# Patient Record
Sex: Female | Born: 1935 | Race: White | Hispanic: No | Smoking: Never smoker
Health system: Southern US, Community
[De-identification: ages and names within clinical notes are randomized; demographics above are authoritative.]

## PROBLEM LIST (undated history)

## (undated) DIAGNOSIS — K529 Noninfective gastroenteritis and colitis, unspecified: Secondary | ICD-10-CM

## (undated) DIAGNOSIS — E079 Disorder of thyroid, unspecified: Secondary | ICD-10-CM

## (undated) DIAGNOSIS — K219 Gastro-esophageal reflux disease without esophagitis: Secondary | ICD-10-CM

## (undated) DIAGNOSIS — I10 Essential (primary) hypertension: Secondary | ICD-10-CM

## (undated) HISTORY — PX: FRACTURE SURGERY: SHX138

## (undated) HISTORY — PX: ESOPHAGEAL DILATION: SHX303

---

## 2006-08-17 ENCOUNTER — Encounter: Admission: RE | Admit: 2006-08-17 | Discharge: 2006-08-17 | Payer: Self-pay | Admitting: Gastroenterology

## 2006-09-08 ENCOUNTER — Ambulatory Visit (HOSPITAL_COMMUNITY): Admission: RE | Admit: 2006-09-08 | Discharge: 2006-09-08 | Payer: Self-pay | Admitting: Gastroenterology

## 2007-01-26 ENCOUNTER — Encounter: Admission: RE | Admit: 2007-01-26 | Discharge: 2007-01-26 | Payer: Self-pay | Admitting: Obstetrics and Gynecology

## 2007-04-06 ENCOUNTER — Inpatient Hospital Stay (HOSPITAL_COMMUNITY): Admission: EM | Admit: 2007-04-06 | Discharge: 2007-04-07 | Payer: Self-pay | Admitting: Gastroenterology

## 2007-11-01 ENCOUNTER — Encounter: Admission: RE | Admit: 2007-11-01 | Discharge: 2007-11-01 | Payer: Self-pay | Admitting: Gastroenterology

## 2007-12-21 ENCOUNTER — Ambulatory Visit (HOSPITAL_COMMUNITY): Admission: RE | Admit: 2007-12-21 | Discharge: 2007-12-21 | Payer: Self-pay | Admitting: Gastroenterology

## 2008-02-13 ENCOUNTER — Inpatient Hospital Stay (HOSPITAL_COMMUNITY): Admission: EM | Admit: 2008-02-13 | Discharge: 2008-02-21 | Payer: Self-pay | Admitting: Emergency Medicine

## 2010-10-28 NOTE — Op Note (Signed)
NAMEWHITLEY, PATCHEN                  ACCOUNT NO.:  [id number]   MEDICAL RECORD NO.:  [id number]          PATIENT TYPE:  INP   LOCATION:  1525                         FACILITY:  [facility]   PHYSICIAN:  Georges Lynch. Gioffre, M.D.DATE OF BIRTH:  [birth year]   DATE OF PROCEDURE:  02/14/2008  DATE OF DISCHARGE:                               OPERATIVE REPORT   SURGEON:  Georges Lynch. Darrelyn Hillock, M.D.   ASSISTANT:  Jamelle Rushing, P.A.   PREOPERATIVE DIAGNOSES:  1. Displaced severely comminuted intertrochanteric fracture of the      right hip.  2. Nondisplaced fracture of the greater tuberosity right shoulder.  3. Displaced comminuted fracture of the olecranon of the right elbow      with a large abrasion.  4. Fractures of several ribs on the left.   POSTOPERATIVE DIAGNOSES:  1. Displaced severely comminuted intertrochanteric fracture of the      right hip.  2. Nondisplaced fracture of the greater tuberosity right shoulder.  3. Displaced comminuted fracture of the olecranon of the right elbow      with a large abrasion.  4. Fractures of several ribs on the left.   OPERATIONS:  Open reduction internal fixation of a comminuted  intertrochanteric fracture right hip utilizing the DePuy hip nail plate  device, it was the TK2.  The compression screw length was 85 mm in  length.  The angle on the plate was a five hole plate, 161 degree angle.  Bone graft to the right intertrochanteric fracture.   PROCEDURE IN DETAIL:  With the patient first having 1 gram of IV Ancef  an initial prep of the right hip was carried out followed by a sterile  prep.  Prior to doing this I did a closed manipulation of the hip,  placed the patient's right lower extremity in traction in the usual  fashion.  Then a sterile prep and drape was carried out as I mentioned  above.  C-arm was brought in.  I made an incision over the lateral  aspect of the right hip.  Bleeders identified and cauterized.  At this  time the incision was  taken out to the greater trochanter.  The  iliotibial band was split followed by splinting of the vastus lateralis.  Good hemostasis was maintained.  I then inserted a Bennett retractor and  identified the fracture site.  A drill hole was made in lateral femoral  cortex initially and the guide pin was inserted and the guide pin was  noted to be in anatomical position on the AP and lateral fluoroscopic  views.  At this time the guide pin was measured to be 85 mm in length.  We then utilized the cannulated drill and our drill holes made up into  the femoral neck.  All steps were carried out with the C-arm in place to  make sure we did not go through the hip femoral head.  We had good  position of the head.  We then inserted our nail compression hip screw  plate device and had excellent position.  The five hole plate  was  affixed to the femoral shaft with the appropriate screw length.  At this  time because of the comminution of the fracture I inserted the cancellus  bone graft into the greater trochanteric fracture site.  Prior to doing  this I irrigated the hip out with antibiotic solution.  Once the bone  graft was inserted I then injected 10 mL of FloSeal for hemostasis  purposes and then closed the wound in layers in the usual fashion.  I  did utilize some Gelfoam in the wound.  After the wound was closed  sterile dressings were applied.  Following that we then removed the  cast, the splint that she had on the right elbow.  We inspected the  abrasion.  She had a large abrasion site.  This was not an open  fracture.  This abrasion site was directly over the olecranon site.  We  dressed that with a Betadine dressing and brought the elbow out in  extension to reduce the fracture site.  We felt that this  was the safest thing to do and not to do an open reduction at this time  as the fracture olecranon was comminuted and also because of this large  abrasion.  We applied a posterior plaster  splint and extension and we  used the C-arm for our direction.  The patient then left the operating  room in satisfactory condition.           ______________________________  Georges Lynch Darrelyn Hillock, M.D.     RAG/MEDQ  D:  02/14/2008  T:  02/15/2008  Job:  [id number]   cc:   Thora Lance, M.D.  Fax: [phone]

## 2010-10-28 NOTE — Op Note (Signed)
Yvette Andrews, Yvette Andrews                  ACCOUNT NO.:  [id number]   MEDICAL RECORD NO.:  [id number]          PATIENT TYPE:  AMB   LOCATION:  ENDO                         FACILITY:  [facility]   PHYSICIAN:  Danise Edge, M.D.   DATE OF BIRTH:  08-28-1920   DATE OF PROCEDURE:  12/21/2007  DATE OF DISCHARGE:                               OPERATIVE REPORT   Esophagogastroduodenoscopy with Savary esophageal dilation.   REFERRING PHYSICIAN:  Thora Lance, MD.   PROCEDURE INDICATIONS:  Yvette Andrews is an 77 year old female born on  Nov 24, 1920.  Yvette Andrews is experiencing cervical esophageal  dysphagia.  Her liquid barium and barium tablet swallow shows an  anterior cervical esophageal web.  The barium tablet stopped in the  esophageal body.   PAST MEDICAL HISTORY:  Hypertension; chronic headaches; restless leg  syndrome; gastroesophageal reflux disease; osteopenia; cholecystectomy;  back surgery; neck surgery; urinary bladder dysfunction; and normal  colonoscopy, except for left colonic diverticulosis on April 06, 2007.   FAMILY HISTORY:  Noncontributory.   HABITS:  Yvette Andrews does not use tobacco products or consume alcohol.   ENDOSCOPIST:  Danise Edge, MD.   PREMEDICATION:  1. Fentanyl 50 mcg.  2. Versed 5 mg.   PROCEDURE:  After obtaining informed consent, Yvette Andrews was placed in  the left lateral decubitus position on the fluoroscopy table.  I  administered intravenous fentanyl and intravenous Versed to achieve  conscious sedation for the procedure.  The patient's blood pressure,  oxygen saturation, and cardiac rhythm were monitored throughout the  procedure and documented in the medical record.   The Pentax gastroscope was passed through the posterior hypopharynx into  the proximal esophagus without difficulty.  The hypopharynx, larynx, and  vocal cords appeared normal.   Esophagoscopy:  The proximal mid and lower segments of the esophageal  mucosa appear completely  normal.  The squamocolumnar junction is noted  at 40 cm from the incisor teeth.  There is no esophageal erosions,  obstruction, or stricture formation.  There is no signs of Barrett  esophagus.   Gastroscopy:  Retroflex view of the gastric cardia and fundus was  normal.  The diaphragmatic hiatus was patulous.  The gastric body,  antrum, and pylorus appeared completely normal.   Duodenoscopy:  The duodenal bulb and descending duodenum appeared  normal.   Savary esophageal dilation:  The Savary dilator wire was passed through  the gastroscope and the tip of the guidewire advanced to the distal  gastric antrum, as confirmed endoscopically and fluoroscopically.  The  15-mm Savary dilator passed without resistance.  Repeat  esophagogastrostomy revealed and an intact and normal esophageal mucosa  without signs of esophageal stricture dilation.  There is no gastric  trauma due to the guidewire.   ASSESSMENT:  Completely normal esophagogastroduodenoscopy.  The 15-mm  Savary dilator passed without resistance and without signs of esophageal  stricture dilation.   RECOMMENDATIONS:  I suspect Ms. Heal's cervical esophageal dysphagia  is due to a poorly functioning upper esophageal sphincter, which there  is no good treatment at age 8.  ______________________________  Danise Edge, M.D.     MJ/MEDQ  D:  12/21/2007  T:  12/21/2007  Job:  [id number]   cc:   Thora Lance, M.D.

## 2010-10-28 NOTE — H&P (Signed)
NAMESERRIA, SLOMA                  ACCOUNT NO.:  [id number]   MEDICAL RECORD NO.:  [id number]          PATIENT TYPE:  INP   LOCATION:  1525                         FACILITY:  [facility]   PHYSICIAN:  Georges Lynch. Gioffre, M.D.DATE OF BIRTH:  [birth year]   DATE OF ADMISSION:  02/13/2008  DATE OF DISCHARGE:                              HISTORY & PHYSICAL   CHIEF COMPLAINT:  Right elbow, right hip, rib cage pain.   HISTORY OF PRESENT ILLNESS:  The patient is a 77 year old female who was  walking down the driveway today when her legs gave out, tripping,  falling, landing on her right side.  She had pain.  She was brought to  emergency room for evaluation. She was found to have a right hip three-  part intertrochanteric fracture, a right olecranon process, a fracture  of right proximal greater tuberosity humeral fracture, and multiple rib  fractures.  The patient's last meal was an Ensure about 1 o'clock today.  The patient will be admitted for pain control and surgical correction.   PAST MEDICAL HISTORY:  Includes thyroid disease, hypertension, chronic  headaches, GERD, osteopenia, bladder dysfunction.   PAST SURGICAL HISTORY:  Includes endoscopy with esophageal dilatation,  neck surgery, gallbladder surgery.   FAMILY MEDICAL HISTORY:  Patient is an elderly female.  She does not  smoke or use alcohol.   ALLERGIES:  CODEINE.   CURRENT MEDICATIONS:  Topamax, Synthroid, Requip, Benicar, Sanctura,  Fosamax, OxyContin, aspirin, __________, Microbid, multivitamins and  Effexor XR.   REVIEW OF SYSTEMS:  Review of systems is noncontributory for any  respiratory, cardiac, neuro, hematologic.  She still does have a little  difficulty at times swallowing.   PHYSICAL EXAMINATION:  VITALS:  Temperature of 98.3, pulse of 93,  respirations 16, blood pressure is 144/81.  GENERAL:  This is an elderly appearing, well-developed female, appears  to be uncomfortable.  HEENT: Head was normocephalic.  Pupils  equal, round, reactive.  Hearing  is intact.  NECK:  Neck was supple.  No palpable lymphadenopathy.  Good range of  motion without any discomfort.  CHEST:  Lung sounds were clear throughout.  She did have some left-sided  rib cage pain.  CARDIOVASCULAR:  Heart was regular rate and rhythm and no murmurs, rubs,  gallops.  ABDOMEN:  Abdomen was soft, nontender.  Bowel.  Present.  Pelvis was stable.  EXTREMITIES:  The right arm was in a long posterior splint. Her hand was  neurologically intact.  Her right lower extremity was externally rotated  and shortened. She had a good sensation and pulses in the foot, left  lower extremity and upper extremities normal without any discomfort.  NEURO:  She had no gross neurologic defects noted.   LABORATORY DATA:  Labs and x-rays shows that she has multiple part right  olecranon process fracture.  She has multiple part right hip  intertrochanteric fracture.  She has multiple right and left rib  fractures.  She has a proximal greater tuberosity fracture.   Chemical labs are pending.   IMPRESSION:  1. Right elbow olecranon fracture.  2. Right  hip intertrochanteric fracture.  3. Right proximal humerus greater tuberosity fracture.  4. Multiple ribs fracture.   DISPOSITION:  At this time this patient will be admitted to [facility] under the care of  Dr. Ranee Gosselin.  The patient did routine  preoperative lab work.  Due to the surgical schedule in the operating  room, she will be scheduled for tomorrow at 4:30 at the first available  time.      Jamelle Rushing, P.A.    ______________________________  Georges Lynch Darrelyn Hillock, M.D.    RWK/MEDQ  D:  02/13/2008  T:  02/14/2008  Job:  [id number]

## 2010-10-28 NOTE — Discharge Summary (Signed)
NAMESANJUANA, Yvette Andrews                  ACCOUNT NO.:  [id number]   MEDICAL RECORD NO.:  [id number]          PATIENT TYPE:  INP   LOCATION:  1525                         FACILITY:  [facility]   PHYSICIAN:  Georges Lynch. Gioffre, M.D.DATE OF BIRTH:  1920-07-01   DATE OF ADMISSION:  02/13/2008  DATE OF DISCHARGE:                               DISCHARGE SUMMARY   Yvette Andrews was admitted on hospital on August 31, taken to surgery on  February 14, 2008, at which time I did:  (1) An open reduction and  internal fixation of a severely comminuted intertrochanteric fracture of  the right hip.  (2) Did a closed manipulation of a comminuted fracture  of her right proximal ulna.  She was placed in a long-arm splint with  her elbow in extension for reduction of the fracture.  The fracture site  actually had a large abrasion over there and there was no way that I  would consider that fracture at that time.  Also, the olecranon fracture  was slightly comminuted and the fact that she is 77 years old, I did not  think that was the thing to do as far as any open reduction.  Postop she  was monitored.  She was kept on the Coumadin and heparin protocol and  did quite nicely.  She had no specific complications.  I would not allow  her to weightbear.  The most I let her do in the hospital stay was bed  to chair, and we did get her up with a lift.  Her wound was examined  daily and she was seen daily.  We did manage her initially with IV  antibiotics.  In the hospital we followed her hemoglobins and  hematocrits and she was given 2 units of packed cells in the OR and  postop she was given 2 more units, so she had a total of 4 units.  Her  hemoglobin stabilized, it was about 9.3 prior to admission.  Her INR  remained at about 2.5 and she was managed on 5 mg of Coumadin.  As I  said, she was seen daily and had no specific problems.  She remained  afebrile.  On February 21, 2008, the date of discharge, I removed her  posterior splint from the right arm and examined her abrasion.  It was  clean.  It was still open but clean.  There was no drainage, no  infection.  I reapplied new dressings and placed her back in a long-arm  splint in extension.  Her wound was fine as far as her right hip.  Circulation in the lower extremities was intact.   The laboratory studies are all documented on the chart.  She had  multiple studies done in the hospital.  Her initial CBC was 12.8,  hemoglobin, and this was done on August 31, was 9.9 and the hematocrit  was 30.3.  The platelets were 149,000.  The INR on August 31 was 1.2.  The prothrombin time was 15.1, the PTT was 28.  The C-metabolic was  within the normal limits except her  glucose was slightly elevated at  120.  Her initial BUN was 28, upper limits of normal was 23.  Her  potassium was 4.3, her sodium 141.  Chloride was 107.  The urinalysis  was positive for nitrites.  She had a large amount of leukocytes as  well.  She was maintained on IV antibiotics.  She had many bacteria in  her urine.  I do not have any results of a urine culture, though.   The x-rays, the initial x-ray of her hip showed a comminuted  intertrochanteric fracture of the right hip.  The right elbow showed a  fracture, displaced, of the olecranon which was comminuted.  She had x-  rays of the right tibia and fibula that showed no acute findings.  Initial x-ray showed that she had a nondisplaced fracture of the  proximal humeral head, more of an avulsion-type fracture.  X-rays also  showed 2 rib fractures on the left.  EKG showed sinus tachycardia and a  T-wave abnormality, and it was reported as an abnormal EKG.   FINAL DISCHARGE DIAGNOSES:  1. Rib fractures x2 on the left, nondisplaced.  2. Nondisplaced fracture of the proximal humeral head.  3. Displaced comminuted fracture of her olecranon, right elbow.  4. Abrasion, right elbow.  5. Displaced intertrochanteric fracture of the right  hip.  6. Hypertension.  7. Gastroesophageal reflux disease.  8. Osteopenia.  9. Chronic headaches.  10.Hypertension.  11.Bladder dysfunction.  12.Thyroid disease.   DISCHARGE MEDICATIONS:  She will be maintained on:  1. Dilaudid 2 mg every 8 hours p.r.n. for pain.  2. Would try to use Tylenol for pain first to see if that would help.  3. Coumadin 5 mg daily.  4. Effexor XR 150 mg a day.  5. Methenamine hippurate 1000 mg daily.  6. Requip 1 mg tablet before sleep.  7. Synthroid 0.025 mg daily or as reported as 25 mcg.  8. Benicar 20 mg daily.  9. Vesicare mg daily.  10.Colace 100 mg b.i.d. as a stool softener.  11.Trinsicon 1 by mouth t.i.d. for 3 weeks.   DISCHARGE INSTRUCTIONS:  1. MONITOR HER INR ON A WEEKLY BASIS.  NEXT INR SHOULD BE DONE NEXT      MONDAY, SEPTEMBER 14.  2. She should remain on her Coumadin for a total of 3 more weeks and      then off of the Coumadin and on one baby aspirin a day.  3. The INR should be maintained once a week until she is off the      Coumadin.  4. There is absolutely no walking, no weightbearing on the right hip      because of her hip fracture.  5. She can be up from bed to chair.  6. She should have a pillow under each leg at all times to keep her      heels off the bed.  7. She should be able to be turned from side to side with a pillow      between her legs to make sure she does not develop a pressure sore      in her buttocks.  8. Far as the care of the fracture of the right elbow, nothing needs      to be done except to leave her in the splint and you can put a      pillow under that to keep it elevated.  9. Last but not least, SHE SHOULD BE SEEN IN THE  OFFICE A WEEK FROM      THIS THURSDAY, WHICH WOULD BE SEPTEMBER 17.  She can come by      ambulance and we will verify that for her.  If there are any      questions, any problems, call Dr. Darrelyn Hillock.  My number at the office      as [phone].  My extensions are [phone] or  [phone].           ______________________________  Georges Lynch. Darrelyn Hillock, M.D.     RAG/MEDQ  D:  02/21/2008  T:  02/21/2008  Job:  [id number]

## 2010-10-28 NOTE — H&P (Signed)
Yvette Andrews, MANDERS                  ACCOUNT NO.:  [id number]   MEDICAL RECORD NO.:  [id number]          PATIENT TYPE:  INP   LOCATION:  1535                         [facility]ACILITY:  [facility]   PHYSICIAN:  Danise Edge, M.D.   DATE OF BIRTH:  [birth year]   DATE OF ADMISSION:  04/06/2007  DATE OF DISCHARGE:  04/07/2007                              HISTORY & PHYSICAL   ADMISSION DIAGNOSIS:  1. Resolved lower gastrointestinal bleeding.  2. Esophageal dysphagia.   HISTORY:  Yvette Andrews is an 77 year old female born [birth year].  [name] was admitted to [facility] April 06, 2007 to  evaluate and treat painless hematochezia.  There is no past history of  colorectal neoplasia.  She has never undergone a lower gastrointestinal  evaluation.  On September 08, 2006, she underwent an  esophagogastroduodenoscopy to dilate a symptomatic cervical esophageal  web.  Her esophagogastroduodenoscopy was otherwise normal.  She has  redeveloped mild cervical esophageal dysphagia.  On June 11, 2006,  her complete metabolic profile and thyroid-stimulating hormone level  were normal.  Her hemoglobin was 12.4 grams.  White blood cell count  6800.  Platelet count 196,000.   ALLERGIES:  CODEINE.   CHRONIC MEDICATIONS:  Chronic   MEDICATIONS:  1. Topamax 50 mg twice daily.  2. Synthroid 25 mcg daily.  3. Requip 1 mg nightly.  4. Benicar 20 mg daily.  5. Sanctura 820 mg twice daily.  6. Fosamax 35 mg weekly.  7. OxyContin 20 mg twice daily.  8. Aspirin 81 mg daily.  9. Aciphex 20 mg daily.  10.Macrobid 100 mg twice daily.  11.Multivitamin daily.  12.Effexor XR 75 mg daily.   PAST MEDICAL HISTORY:  1. Hypertension.  2. Chronic headaches.  3. Restless leg syndrome.  4. Gastroesophageal reflux disease.  5. Osteopenia.  6. Cholecystectomy.  7. Back surgery.  8. Neck surgery September 08, 2006.  9. Esophagogastroduodenoscopy was normal with disruption of cervical      esophageal webs  using the 15 mm Savary dilator.  10.Urinary bladder dysfunction.   FAMILY HISTORY:  Noncontributory.   HABITS:  Mrs. Voth does not use tobacco products or consume alcohol.   PHYSICAL EXAMINATION:  VITAL SIGNS:  Blood pressure 128/70.  HEENT:  Sclerae nonicteric.  Oropharynx normal.  Dentition in poor  repair.  Tympanic membranes and external canals normal.  NECK:  No carotid.  No thyroid enlargement.  LUNGS:  Clear to auscultation.  CARDIAC:  Regular rhythm without murmurs, clicks or rubs.  ABDOMEN:  Soft, flat and nontender.  EXTREMITIES:  No edema.   ASSESSMENT:  1. Resolved lower gastrointestinal bleeding.  2. Cervical esophageal dysphagia.   PROCEDURE:  Esophagogastroduodenoscopy.   ENDOSCOPIST:  Danise Edge, M.D.   PREMEDICATION:  Versed 5 mg, fentanyl 50 mcg.   DESCRIPTION OF PROCEDURE:  After obtaining informed consent, Yvette Andrews  was placed in the left lateral decubitus position.  I administered  intravenous fentanyl and intravenous Versed to achieve conscious  sedation for the procedure.  The patient's blood pressure, oxygen  saturation and cardiac rhythm were monitored throughout  the procedure  and documented in the medical record.  The Pentax gastroscope was passed  through the posterior hypopharynx into the proximal esophagus without  difficulty.  The hypopharynx, larynx and vocal cords appeared normal.   Esophagoscopy:  The proximal mid and lower segments of the esophageal  mucosa appear completely normal.  The squamocolumnar junction and  esophagogastric junction are noted at approximately 37 cm from the  incisor teeth.   Gastroscopy:  Retroflex view of the gastric cardia and fundus was  normal.  The gastric body, antrum and pylorus appeared normal.   Duodenoscopy:  The duodenal bulb and descending duodenum appeared  normal.   ASSESSMENT:  Normal esophagogastroduodenoscopy.   PROCEDURE:  Proctocolonoscopy to the cecum.   DESCRIPTION OF PROCEDURE:   Anal inspection and digital rectal exam were  normal.  The Pentax pediatric colonoscope was introduced into the rectum  and with moderate difficulty advanced to the cecum as identified by a  normal-appearing ileocecal valve and appendiceal orifice.  Colonoscopic  advancement was tender by colonic loop formation.  Preparation for the  colonoscopy today was excellent.   Rectum normal:  Retroflex view of the distal rectum normal.  Sigmoid colon and descending colon:  Left colonic diverticulosis.  Splenic flexure normal.  Transverse colon normal.  Hepatic flexure normal.  Ascending colon normal.  Cecum and ileocecal valve normal.   ASSESSMENT:  Left colonic diverticulosis; otherwise normal  proctocolonoscopy to the cecum.  No signs of colorectal neoplasia or  gastrointestinal bleeding.   PLAN:  Yvette Andrews will be discharged from the hospital this morning.  She will resume taking her chronic medications.  She will follow-up with  Dr. Kirby Funk.   DISCHARGE LABORATORY DATA:  CBC with differential normal.  Complete  metabolic profile normal.  Thyroid stimulating hormone level normal.           ______________________________  Danise Edge, M.D.     MJ/MEDQ  D:  04/07/2007  T:  04/08/2007  Job:  [id number]   cc:   Thora Lance, M.D.  Fax: [phone]

## 2010-10-31 NOTE — Op Note (Signed)
Yvette Andrews, Yvette Andrews                  ACCOUNT NO.:  [id number]   MEDICAL RECORD NO.:  [id number]          PATIENT TYPE:  AMB   LOCATION:  ENDO                         FACILITY:  [facility]   PHYSICIAN:  Danise Edge, M.D.   DATE OF BIRTH:  08-01-1920   DATE OF PROCEDURE:  09/08/2006  DATE OF DISCHARGE:                               OPERATIVE REPORT   PROCEDURE:  Esophagogastroduodenoscopy and Savary esophageal dilation.   REFERRING PHYSICIAN:  Dr. Kirby Funk.   PROCEDURE INDICATIONS:  [name] is an 78 year old female born  09/10/1920.  When [name] developed cervical esophageal  dysphagia, she underwent a barium swallow x-ray which revealed a  cervical esophageal web or stricture.   ENDOSCOPIST:  Danise Edge, M.D.   PREMEDICATION:  Fentanyl 75 mcg, Versed 4 mg.   PROCEDURE:  After obtaining informed consent Ms. Reuter was placed in  the left lateral decubitus position on the fluoroscopy table.  I  administered intravenous fentanyl and intravenous Versed to achieve  conscious sedation for the procedure.  The patient's blood pressure,  oxygen saturation and cardiac rhythm were monitored throughout the  procedure and documented in the medical record.   The Pentax gastroscope was easily passed through the posterior  hypopharynx into the proximal esophagus without difficulty.  The  patient's blood pressure, oxygen saturation and cardiac rhythm were  monitored throughout the procedure and documented in the medical record.   The hypopharynx, larynx and vocal cords appeared normal.   Esophagoscopy:  The proximal, mid and lower segments of the esophageal  mucosa appeared normal.  I suspect I ruptured a cervical esophageal web  passing the Pentax gastroscope through the hypopharynx and into the  proximal esophagus.   Gastroscopy:  Retroflex view of the gastric cardia and fundus was  normal.  The diaphragmatic hiatus was slightly patulous.  The gastric  body, antrum and  pylorus appeared normal.   Duodenoscopy:  The duodenal bulb and descending duodenum appeared  normal.   Savary esophageal dilation:  The Savary dilator wire was passed through  the Pentax gastroscope and advanced to the distal gastric antrum as  confirmed both endoscopically and fluoroscopically.  Under fluoroscopic  guidance, the 12.8-mm and 15-mm Savary dilators passed without  resistance.  Repeat esophagogastrostomy revealed disruption of a  probable cervical esophageal web and no gastric trauma due to the  guidewire.   ASSESSMENT:  Cervical esophageal web disrupted with the gastroscope.  Esophageal dilation was then performed with the 12.8-mm and 15-mm Savary  dilators.  Otherwise normal esophagogastroduodenoscopy.           ______________________________  Danise Edge, M.D.     MJ/MEDQ  D:  09/08/2006  T:  09/08/2006  Job:  [id number]   cc:   Thora Lance, M.D.

## 2011-03-18 LAB — BASIC METABOLIC PANEL
BUN: 13
BUN: 14
BUN: 21
CO2: 30
Calcium: 8 — ABNORMAL LOW
Chloride: 102
Chloride: 104
Chloride: 99
Creatinine, Ser: 0.77
Creatinine, Ser: 0.89
GFR calc Af Amer: >60
GFR calc non Af Amer: >60
GFR calc non Af Amer: >60
Glucose, Bld: 104 — ABNORMAL HIGH
Glucose, Bld: 149 — ABNORMAL HIGH
Potassium: 3.6
Potassium: 3.9
Sodium: 135
Sodium: 137
Sodium: 138
Sodium: 140

## 2011-03-18 LAB — PROTIME-INR
INR: 1.4
INR: 2.1 — ABNORMAL HIGH
INR: 2.1 — ABNORMAL HIGH
INR: 2.5 — ABNORMAL HIGH
INR: 2.7 — ABNORMAL HIGH
INR: 2.9 — ABNORMAL HIGH
Prothrombin Time: 17.8 — ABNORMAL HIGH
Prothrombin Time: 24.4 — ABNORMAL HIGH
Prothrombin Time: 25 — ABNORMAL HIGH
Prothrombin Time: 28.3 — ABNORMAL HIGH
Prothrombin Time: 30.2 — ABNORMAL HIGH
Prothrombin Time: 31 — ABNORMAL HIGH
Prothrombin Time: 32.1 — ABNORMAL HIGH

## 2011-03-18 LAB — URINE CULTURE: Special Requests: NEGATIVE

## 2011-03-18 LAB — URINALYSIS, ROUTINE W REFLEX MICROSCOPIC
Bilirubin Urine: NEGATIVE
Glucose, UA: NEGATIVE
Ketones, ur: NEGATIVE
Nitrite: POSITIVE — AB

## 2011-03-18 LAB — HEMOGLOBIN AND HEMATOCRIT, BLOOD
HCT: 26.2 — ABNORMAL LOW
HCT: 28.4 — ABNORMAL LOW
HCT: 29.1 — ABNORMAL LOW
Hemoglobin: 10.5 — ABNORMAL LOW
Hemoglobin: 8.9 — ABNORMAL LOW

## 2011-03-18 LAB — URINE MICROSCOPIC-ADD ON

## 2011-03-25 LAB — COMPREHENSIVE METABOLIC PANEL
ALT: 32
AST: 39 — ABNORMAL HIGH
Albumin: 3.4 — ABNORMAL LOW
CO2: 23
Chloride: 109
Creatinine, Ser: 1.02
GFR calc Af Amer: >60
GFR calc non Af Amer: 52 — ABNORMAL LOW
Sodium: 138
Total Bilirubin: 0.8

## 2011-03-25 LAB — CBC
MCV: 98
Platelets: 195
RBC: 3.4 — ABNORMAL LOW
WBC: 6.9

## 2011-03-25 LAB — DIFFERENTIAL
Basophils Absolute: 0
Eosinophils Absolute: 0.1
Eosinophils Relative: 2
Lymphocytes Relative: 25
Lymphs Abs: 1.7
Monocytes Absolute: 0.5

## 2011-06-22 DIAGNOSIS — E039 Hypothyroidism, unspecified: Secondary | ICD-10-CM | POA: Diagnosis not present

## 2011-06-22 DIAGNOSIS — R350 Frequency of micturition: Secondary | ICD-10-CM | POA: Diagnosis not present

## 2011-06-22 DIAGNOSIS — Z79899 Other long term (current) drug therapy: Secondary | ICD-10-CM | POA: Diagnosis not present

## 2011-06-22 DIAGNOSIS — E559 Vitamin D deficiency, unspecified: Secondary | ICD-10-CM | POA: Diagnosis not present

## 2011-06-22 DIAGNOSIS — G2589 Other specified extrapyramidal and movement disorders: Secondary | ICD-10-CM | POA: Diagnosis not present

## 2011-06-22 DIAGNOSIS — K219 Gastro-esophageal reflux disease without esophagitis: Secondary | ICD-10-CM | POA: Diagnosis not present

## 2011-06-22 DIAGNOSIS — I10 Essential (primary) hypertension: Secondary | ICD-10-CM | POA: Diagnosis not present

## 2011-06-22 DIAGNOSIS — N390 Urinary tract infection, site not specified: Secondary | ICD-10-CM | POA: Diagnosis not present

## 2011-08-03 DIAGNOSIS — Z79899 Other long term (current) drug therapy: Secondary | ICD-10-CM | POA: Diagnosis not present

## 2011-08-03 DIAGNOSIS — G43109 Migraine with aura, not intractable, without status migrainosus: Secondary | ICD-10-CM | POA: Diagnosis not present

## 2011-08-03 DIAGNOSIS — R32 Unspecified urinary incontinence: Secondary | ICD-10-CM | POA: Diagnosis not present

## 2011-08-03 DIAGNOSIS — E785 Hyperlipidemia, unspecified: Secondary | ICD-10-CM | POA: Diagnosis not present

## 2011-08-03 DIAGNOSIS — E039 Hypothyroidism, unspecified: Secondary | ICD-10-CM | POA: Diagnosis not present

## 2011-08-18 DIAGNOSIS — E039 Hypothyroidism, unspecified: Secondary | ICD-10-CM | POA: Diagnosis not present

## 2011-08-18 DIAGNOSIS — I10 Essential (primary) hypertension: Secondary | ICD-10-CM | POA: Diagnosis not present

## 2011-08-18 DIAGNOSIS — N390 Urinary tract infection, site not specified: Secondary | ICD-10-CM | POA: Diagnosis not present

## 2011-08-18 DIAGNOSIS — K219 Gastro-esophageal reflux disease without esophagitis: Secondary | ICD-10-CM | POA: Diagnosis not present

## 2011-08-18 DIAGNOSIS — L2089 Other atopic dermatitis: Secondary | ICD-10-CM | POA: Diagnosis not present

## 2011-08-18 DIAGNOSIS — E559 Vitamin D deficiency, unspecified: Secondary | ICD-10-CM | POA: Diagnosis not present

## 2011-08-18 DIAGNOSIS — G2589 Other specified extrapyramidal and movement disorders: Secondary | ICD-10-CM | POA: Diagnosis not present

## 2011-08-18 DIAGNOSIS — R350 Frequency of micturition: Secondary | ICD-10-CM | POA: Diagnosis not present

## 2011-09-14 DIAGNOSIS — G8921 Chronic pain due to trauma: Secondary | ICD-10-CM | POA: Diagnosis not present

## 2011-09-14 DIAGNOSIS — E039 Hypothyroidism, unspecified: Secondary | ICD-10-CM | POA: Diagnosis not present

## 2011-09-14 DIAGNOSIS — I10 Essential (primary) hypertension: Secondary | ICD-10-CM | POA: Diagnosis not present

## 2011-10-27 DIAGNOSIS — E039 Hypothyroidism, unspecified: Secondary | ICD-10-CM | POA: Diagnosis not present

## 2011-10-27 DIAGNOSIS — G8921 Chronic pain due to trauma: Secondary | ICD-10-CM | POA: Diagnosis not present

## 2011-10-27 DIAGNOSIS — G43109 Migraine with aura, not intractable, without status migrainosus: Secondary | ICD-10-CM | POA: Diagnosis not present

## 2011-10-27 DIAGNOSIS — I10 Essential (primary) hypertension: Secondary | ICD-10-CM | POA: Diagnosis not present

## 2011-11-27 DIAGNOSIS — L821 Other seborrheic keratosis: Secondary | ICD-10-CM | POA: Diagnosis not present

## 2011-12-07 DIAGNOSIS — N3020 Other chronic cystitis without hematuria: Secondary | ICD-10-CM | POA: Diagnosis not present

## 2011-12-07 DIAGNOSIS — N3941 Urge incontinence: Secondary | ICD-10-CM | POA: Diagnosis not present

## 2011-12-08 DIAGNOSIS — E039 Hypothyroidism, unspecified: Secondary | ICD-10-CM | POA: Diagnosis not present

## 2011-12-08 DIAGNOSIS — I10 Essential (primary) hypertension: Secondary | ICD-10-CM | POA: Diagnosis not present

## 2011-12-08 DIAGNOSIS — G43109 Migraine with aura, not intractable, without status migrainosus: Secondary | ICD-10-CM | POA: Diagnosis not present

## 2012-01-19 DIAGNOSIS — G8921 Chronic pain due to trauma: Secondary | ICD-10-CM | POA: Diagnosis not present

## 2012-01-19 DIAGNOSIS — I10 Essential (primary) hypertension: Secondary | ICD-10-CM | POA: Diagnosis not present

## 2012-01-19 DIAGNOSIS — L600 Ingrowing nail: Secondary | ICD-10-CM | POA: Diagnosis not present

## 2012-01-19 DIAGNOSIS — E039 Hypothyroidism, unspecified: Secondary | ICD-10-CM | POA: Diagnosis not present

## 2012-02-17 DIAGNOSIS — G43109 Migraine with aura, not intractable, without status migrainosus: Secondary | ICD-10-CM | POA: Diagnosis not present

## 2012-02-17 DIAGNOSIS — E039 Hypothyroidism, unspecified: Secondary | ICD-10-CM | POA: Diagnosis not present

## 2012-02-17 DIAGNOSIS — I10 Essential (primary) hypertension: Secondary | ICD-10-CM | POA: Diagnosis not present

## 2012-02-18 DIAGNOSIS — E039 Hypothyroidism, unspecified: Secondary | ICD-10-CM | POA: Diagnosis not present

## 2012-02-18 DIAGNOSIS — I10 Essential (primary) hypertension: Secondary | ICD-10-CM | POA: Diagnosis not present

## 2012-02-24 DIAGNOSIS — I10 Essential (primary) hypertension: Secondary | ICD-10-CM | POA: Diagnosis not present

## 2012-02-24 DIAGNOSIS — E559 Vitamin D deficiency, unspecified: Secondary | ICD-10-CM | POA: Diagnosis not present

## 2012-02-24 DIAGNOSIS — E039 Hypothyroidism, unspecified: Secondary | ICD-10-CM | POA: Diagnosis not present

## 2012-02-24 DIAGNOSIS — Z1331 Encounter for screening for depression: Secondary | ICD-10-CM | POA: Diagnosis not present

## 2012-02-24 DIAGNOSIS — K219 Gastro-esophageal reflux disease without esophagitis: Secondary | ICD-10-CM | POA: Diagnosis not present

## 2012-02-24 DIAGNOSIS — Z0000 Encounter for general adult medical examination without abnormal findings: Secondary | ICD-10-CM | POA: Diagnosis not present

## 2012-02-24 DIAGNOSIS — H8110 Benign paroxysmal vertigo, unspecified ear: Secondary | ICD-10-CM | POA: Diagnosis not present

## 2012-02-24 DIAGNOSIS — G2589 Other specified extrapyramidal and movement disorders: Secondary | ICD-10-CM | POA: Diagnosis not present

## 2012-02-29 DIAGNOSIS — R300 Dysuria: Secondary | ICD-10-CM | POA: Diagnosis not present

## 2012-02-29 DIAGNOSIS — N3020 Other chronic cystitis without hematuria: Secondary | ICD-10-CM | POA: Diagnosis not present

## 2012-03-21 DIAGNOSIS — E039 Hypothyroidism, unspecified: Secondary | ICD-10-CM | POA: Diagnosis not present

## 2012-03-21 DIAGNOSIS — G43109 Migraine with aura, not intractable, without status migrainosus: Secondary | ICD-10-CM | POA: Diagnosis not present

## 2012-03-21 DIAGNOSIS — I10 Essential (primary) hypertension: Secondary | ICD-10-CM | POA: Diagnosis not present

## 2012-03-30 DIAGNOSIS — H109 Unspecified conjunctivitis: Secondary | ICD-10-CM | POA: Diagnosis not present

## 2012-03-30 DIAGNOSIS — J019 Acute sinusitis, unspecified: Secondary | ICD-10-CM | POA: Diagnosis not present

## 2012-04-25 DIAGNOSIS — I10 Essential (primary) hypertension: Secondary | ICD-10-CM | POA: Diagnosis not present

## 2012-04-25 DIAGNOSIS — E039 Hypothyroidism, unspecified: Secondary | ICD-10-CM | POA: Diagnosis not present

## 2012-04-25 DIAGNOSIS — F411 Generalized anxiety disorder: Secondary | ICD-10-CM | POA: Diagnosis not present

## 2012-04-25 DIAGNOSIS — G8921 Chronic pain due to trauma: Secondary | ICD-10-CM | POA: Diagnosis not present

## 2012-05-16 DIAGNOSIS — H04129 Dry eye syndrome of unspecified lacrimal gland: Secondary | ICD-10-CM | POA: Diagnosis not present

## 2012-05-16 DIAGNOSIS — H318 Other specified disorders of choroid: Secondary | ICD-10-CM | POA: Diagnosis not present

## 2012-05-16 DIAGNOSIS — Z961 Presence of intraocular lens: Secondary | ICD-10-CM | POA: Diagnosis not present

## 2012-05-19 DIAGNOSIS — Z23 Encounter for immunization: Secondary | ICD-10-CM | POA: Diagnosis not present

## 2012-05-27 DIAGNOSIS — L819 Disorder of pigmentation, unspecified: Secondary | ICD-10-CM | POA: Diagnosis not present

## 2012-05-27 DIAGNOSIS — L821 Other seborrheic keratosis: Secondary | ICD-10-CM | POA: Diagnosis not present

## 2012-05-27 DIAGNOSIS — L578 Other skin changes due to chronic exposure to nonionizing radiation: Secondary | ICD-10-CM | POA: Diagnosis not present

## 2012-06-02 DIAGNOSIS — I10 Essential (primary) hypertension: Secondary | ICD-10-CM | POA: Diagnosis not present

## 2012-06-02 DIAGNOSIS — E039 Hypothyroidism, unspecified: Secondary | ICD-10-CM | POA: Diagnosis not present

## 2012-06-02 DIAGNOSIS — F411 Generalized anxiety disorder: Secondary | ICD-10-CM | POA: Diagnosis not present

## 2012-07-18 DIAGNOSIS — N3941 Urge incontinence: Secondary | ICD-10-CM | POA: Diagnosis not present

## 2012-07-18 DIAGNOSIS — N3020 Other chronic cystitis without hematuria: Secondary | ICD-10-CM | POA: Diagnosis not present

## 2012-07-18 DIAGNOSIS — N393 Stress incontinence (female) (male): Secondary | ICD-10-CM | POA: Diagnosis not present

## 2012-08-02 DIAGNOSIS — F411 Generalized anxiety disorder: Secondary | ICD-10-CM | POA: Diagnosis not present

## 2012-08-02 DIAGNOSIS — I10 Essential (primary) hypertension: Secondary | ICD-10-CM | POA: Diagnosis not present

## 2012-08-02 DIAGNOSIS — E039 Hypothyroidism, unspecified: Secondary | ICD-10-CM | POA: Diagnosis not present

## 2012-09-06 DIAGNOSIS — M76899 Other specified enthesopathies of unspecified lower limb, excluding foot: Secondary | ICD-10-CM | POA: Diagnosis not present

## 2012-10-04 DIAGNOSIS — F411 Generalized anxiety disorder: Secondary | ICD-10-CM | POA: Diagnosis not present

## 2012-10-04 DIAGNOSIS — E039 Hypothyroidism, unspecified: Secondary | ICD-10-CM | POA: Diagnosis not present

## 2012-10-04 DIAGNOSIS — I10 Essential (primary) hypertension: Secondary | ICD-10-CM | POA: Diagnosis not present

## 2012-10-04 DIAGNOSIS — R6889 Other general symptoms and signs: Secondary | ICD-10-CM | POA: Diagnosis not present

## 2012-11-29 DIAGNOSIS — L819 Disorder of pigmentation, unspecified: Secondary | ICD-10-CM | POA: Diagnosis not present

## 2012-11-29 DIAGNOSIS — L821 Other seborrheic keratosis: Secondary | ICD-10-CM | POA: Diagnosis not present

## 2012-11-29 DIAGNOSIS — L820 Inflamed seborrheic keratosis: Secondary | ICD-10-CM | POA: Diagnosis not present

## 2012-11-29 DIAGNOSIS — L738 Other specified follicular disorders: Secondary | ICD-10-CM | POA: Diagnosis not present

## 2012-12-26 DIAGNOSIS — R300 Dysuria: Secondary | ICD-10-CM | POA: Diagnosis not present

## 2012-12-26 DIAGNOSIS — N390 Urinary tract infection, site not specified: Secondary | ICD-10-CM | POA: Diagnosis not present

## 2012-12-26 DIAGNOSIS — N3020 Other chronic cystitis without hematuria: Secondary | ICD-10-CM | POA: Diagnosis not present

## 2012-12-26 DIAGNOSIS — R319 Hematuria, unspecified: Secondary | ICD-10-CM | POA: Diagnosis not present

## 2013-03-27 DIAGNOSIS — Q619 Cystic kidney disease, unspecified: Secondary | ICD-10-CM | POA: Diagnosis not present

## 2013-03-27 DIAGNOSIS — N3020 Other chronic cystitis without hematuria: Secondary | ICD-10-CM | POA: Diagnosis not present

## 2013-04-05 DIAGNOSIS — Z23 Encounter for immunization: Secondary | ICD-10-CM | POA: Diagnosis not present

## 2013-06-05 DIAGNOSIS — R300 Dysuria: Secondary | ICD-10-CM | POA: Diagnosis not present

## 2013-06-05 DIAGNOSIS — N3020 Other chronic cystitis without hematuria: Secondary | ICD-10-CM | POA: Diagnosis not present

## 2013-07-26 DIAGNOSIS — E039 Hypothyroidism, unspecified: Secondary | ICD-10-CM | POA: Diagnosis not present

## 2013-07-26 DIAGNOSIS — G2589 Other specified extrapyramidal and movement disorders: Secondary | ICD-10-CM | POA: Diagnosis not present

## 2013-07-26 DIAGNOSIS — E559 Vitamin D deficiency, unspecified: Secondary | ICD-10-CM | POA: Diagnosis not present

## 2013-07-26 DIAGNOSIS — K219 Gastro-esophageal reflux disease without esophagitis: Secondary | ICD-10-CM | POA: Diagnosis not present

## 2013-07-26 DIAGNOSIS — R350 Frequency of micturition: Secondary | ICD-10-CM | POA: Diagnosis not present

## 2013-07-26 DIAGNOSIS — I10 Essential (primary) hypertension: Secondary | ICD-10-CM | POA: Diagnosis not present

## 2013-07-26 DIAGNOSIS — R51 Headache: Secondary | ICD-10-CM | POA: Diagnosis not present

## 2013-08-07 DIAGNOSIS — R300 Dysuria: Secondary | ICD-10-CM | POA: Diagnosis not present

## 2013-08-07 DIAGNOSIS — N3020 Other chronic cystitis without hematuria: Secondary | ICD-10-CM | POA: Diagnosis not present

## 2013-08-16 DIAGNOSIS — L819 Disorder of pigmentation, unspecified: Secondary | ICD-10-CM | POA: Diagnosis not present

## 2013-08-16 DIAGNOSIS — L821 Other seborrheic keratosis: Secondary | ICD-10-CM | POA: Diagnosis not present

## 2013-08-16 DIAGNOSIS — D2330 Other benign neoplasm of skin of unspecified part of face: Secondary | ICD-10-CM | POA: Diagnosis not present

## 2013-08-23 DIAGNOSIS — I10 Essential (primary) hypertension: Secondary | ICD-10-CM | POA: Diagnosis not present

## 2013-08-23 DIAGNOSIS — G2589 Other specified extrapyramidal and movement disorders: Secondary | ICD-10-CM | POA: Diagnosis not present

## 2013-08-23 DIAGNOSIS — K219 Gastro-esophageal reflux disease without esophagitis: Secondary | ICD-10-CM | POA: Diagnosis not present

## 2013-08-23 DIAGNOSIS — R51 Headache: Secondary | ICD-10-CM | POA: Diagnosis not present

## 2013-08-23 DIAGNOSIS — R350 Frequency of micturition: Secondary | ICD-10-CM | POA: Diagnosis not present

## 2013-08-23 DIAGNOSIS — R300 Dysuria: Secondary | ICD-10-CM | POA: Diagnosis not present

## 2013-08-23 DIAGNOSIS — E559 Vitamin D deficiency, unspecified: Secondary | ICD-10-CM | POA: Diagnosis not present

## 2013-08-23 DIAGNOSIS — N390 Urinary tract infection, site not specified: Secondary | ICD-10-CM | POA: Diagnosis not present

## 2013-10-12 DIAGNOSIS — N3020 Other chronic cystitis without hematuria: Secondary | ICD-10-CM | POA: Diagnosis not present

## 2013-10-12 DIAGNOSIS — R300 Dysuria: Secondary | ICD-10-CM | POA: Diagnosis not present

## 2013-11-15 DIAGNOSIS — G2589 Other specified extrapyramidal and movement disorders: Secondary | ICD-10-CM | POA: Diagnosis not present

## 2013-11-15 DIAGNOSIS — R350 Frequency of micturition: Secondary | ICD-10-CM | POA: Diagnosis not present

## 2013-11-15 DIAGNOSIS — IMO0002 Reserved for concepts with insufficient information to code with codable children: Secondary | ICD-10-CM | POA: Diagnosis not present

## 2013-11-15 DIAGNOSIS — B351 Tinea unguium: Secondary | ICD-10-CM | POA: Diagnosis not present

## 2013-11-15 DIAGNOSIS — M79609 Pain in unspecified limb: Secondary | ICD-10-CM | POA: Diagnosis not present

## 2013-11-15 DIAGNOSIS — K219 Gastro-esophageal reflux disease without esophagitis: Secondary | ICD-10-CM | POA: Diagnosis not present

## 2013-11-15 DIAGNOSIS — I10 Essential (primary) hypertension: Secondary | ICD-10-CM | POA: Diagnosis not present

## 2013-11-15 DIAGNOSIS — M25569 Pain in unspecified knee: Secondary | ICD-10-CM | POA: Diagnosis not present

## 2013-11-27 ENCOUNTER — Ambulatory Visit: Payer: Self-pay | Admitting: Podiatry

## 2013-12-01 DIAGNOSIS — S20219A Contusion of unspecified front wall of thorax, initial encounter: Secondary | ICD-10-CM | POA: Diagnosis not present

## 2014-01-08 DIAGNOSIS — R300 Dysuria: Secondary | ICD-10-CM | POA: Diagnosis not present

## 2014-01-08 DIAGNOSIS — N3020 Other chronic cystitis without hematuria: Secondary | ICD-10-CM | POA: Diagnosis not present

## 2014-03-07 DIAGNOSIS — Z23 Encounter for immunization: Secondary | ICD-10-CM | POA: Diagnosis not present

## 2014-03-07 DIAGNOSIS — B351 Tinea unguium: Secondary | ICD-10-CM | POA: Diagnosis not present

## 2014-03-07 DIAGNOSIS — R350 Frequency of micturition: Secondary | ICD-10-CM | POA: Diagnosis not present

## 2014-03-07 DIAGNOSIS — Z1331 Encounter for screening for depression: Secondary | ICD-10-CM | POA: Diagnosis not present

## 2014-03-07 DIAGNOSIS — E039 Hypothyroidism, unspecified: Secondary | ICD-10-CM | POA: Diagnosis not present

## 2014-03-07 DIAGNOSIS — Z0000 Encounter for general adult medical examination without abnormal findings: Secondary | ICD-10-CM | POA: Diagnosis not present

## 2014-03-07 DIAGNOSIS — I10 Essential (primary) hypertension: Secondary | ICD-10-CM | POA: Diagnosis not present

## 2014-03-07 DIAGNOSIS — M159 Polyosteoarthritis, unspecified: Secondary | ICD-10-CM | POA: Diagnosis not present

## 2014-03-07 DIAGNOSIS — E559 Vitamin D deficiency, unspecified: Secondary | ICD-10-CM | POA: Diagnosis not present

## 2014-03-12 DIAGNOSIS — H40009 Preglaucoma, unspecified, unspecified eye: Secondary | ICD-10-CM | POA: Diagnosis not present

## 2014-03-29 DIAGNOSIS — H40052 Ocular hypertension, left eye: Secondary | ICD-10-CM | POA: Diagnosis not present

## 2014-04-02 DIAGNOSIS — H40052 Ocular hypertension, left eye: Secondary | ICD-10-CM | POA: Diagnosis not present

## 2014-05-17 ENCOUNTER — Emergency Department (HOSPITAL_COMMUNITY): Payer: Medicare Other

## 2014-05-17 ENCOUNTER — Encounter (HOSPITAL_COMMUNITY): Payer: Self-pay | Admitting: *Deleted

## 2014-05-17 ENCOUNTER — Emergency Department (HOSPITAL_COMMUNITY)
Admission: EM | Admit: 2014-05-17 | Discharge: 2014-05-18 | Disposition: A | Discharge status: Home / Self Care | Payer: Medicare Other | Attending: Emergency Medicine | Admitting: Emergency Medicine

## 2014-05-17 DIAGNOSIS — I10 Essential (primary) hypertension: Secondary | ICD-10-CM | POA: Diagnosis not present

## 2014-05-17 DIAGNOSIS — R531 Weakness: Secondary | ICD-10-CM | POA: Diagnosis not present

## 2014-05-17 DIAGNOSIS — N3000 Acute cystitis without hematuria: Secondary | ICD-10-CM | POA: Diagnosis not present

## 2014-05-17 DIAGNOSIS — Z79899 Other long term (current) drug therapy: Secondary | ICD-10-CM | POA: Insufficient documentation

## 2014-05-17 DIAGNOSIS — R918 Other nonspecific abnormal finding of lung field: Secondary | ICD-10-CM | POA: Diagnosis not present

## 2014-05-17 DIAGNOSIS — R404 Transient alteration of awareness: Secondary | ICD-10-CM | POA: Diagnosis not present

## 2014-05-17 DIAGNOSIS — R0602 Shortness of breath: Secondary | ICD-10-CM | POA: Diagnosis not present

## 2014-05-17 HISTORY — DX: Essential (primary) hypertension: I10

## 2014-05-17 LAB — BASIC METABOLIC PANEL
Anion gap: 11 (ref 5–15)
BUN: 27 mg/dL — ABNORMAL HIGH (ref 6–23)
CO2: 24 mEq/L (ref 19–32)
Calcium: 9.3 mg/dL (ref 8.4–10.5)
Chloride: 105 mEq/L (ref 96–112)
Creatinine, Ser: 1.3 mg/dL — ABNORMAL HIGH (ref 0.50–1.10)
GFR calc Af Amer: 40 mL/min — ABNORMAL LOW (ref >90)
GFR calc non Af Amer: 35 mL/min — ABNORMAL LOW (ref >90)
Glucose, Bld: 100 mg/dL — ABNORMAL HIGH (ref 70–99)
Potassium: 3.9 mEq/L (ref 3.7–5.3)
Sodium: 140 mEq/L (ref 137–147)

## 2014-05-17 LAB — TROPONIN I: Troponin I: <0.3 ng/mL (ref <0.30)

## 2014-05-17 LAB — CBG MONITORING, ED: Glucose-Capillary: 95 mg/dL (ref 70–99)

## 2014-05-17 LAB — CBC
HCT: 33.9 % — ABNORMAL LOW (ref 36.0–46.0)
Hemoglobin: 11.1 g/dL — ABNORMAL LOW (ref 12.0–15.0)
MCH: 32.7 pg (ref 26.0–34.0)
MCHC: 32.7 g/dL (ref 30.0–36.0)
MCV: 100 fL (ref 78.0–100.0)
Platelets: 160 10*3/uL (ref 150–400)
RBC: 3.39 MIL/uL — ABNORMAL LOW (ref 3.87–5.11)
RDW: 13.3 % (ref 11.5–15.5)
WBC: 6.8 10*3/uL (ref 4.0–10.5)

## 2014-05-17 NOTE — ED Notes (Signed)
Family at bedside. 

## 2014-05-17 NOTE — ED Notes (Signed)
Pt reports bila leg weakness today, ambulates with a walker.  Pt is A&Ox 2.  Unable to identify the bldg and year.  Pt denies any pain at this time.  Pt able to move her legs without difficulty.  No obvious neuro deficits noted

## 2014-05-17 NOTE — Progress Notes (Signed)
EDCM spoke to patient and her family at bedside.  Patient lives at home with her daughter Karna Christmas.  Patient does not have home health services at this time and has never had home health services in the past.  Patient has a walker, bedside commode, wheelchair, and shower chair at home.  Patient's daughter confirms patient's pcp is Dr. Inda Merlin with [facility].  System updated.  Patient's daughter would like home health services set up for the patient.  She has tried to do so through her pcp, but has been unsuccessful.  EDCM spoke to [place] who placed home health orders for RN, PT, OT aide and social work with a face to face.  EDCM provided patient's daughter with list of home health agencies in [facility] of which [place] was chosen.    Patient and patient's family thankful for services.  No further EDCM needs at this time.

## 2014-05-17 NOTE — ED Notes (Signed)
Bed: HC62 Expected date:  Expected time:  Means of arrival:  Comments: EMS 80 yo female from home/generalized weakness lower extremities-no pain

## 2014-05-18 DIAGNOSIS — R531 Weakness: Secondary | ICD-10-CM | POA: Diagnosis not present

## 2014-05-18 LAB — URINALYSIS, ROUTINE W REFLEX MICROSCOPIC
Bilirubin Urine: NEGATIVE
Glucose, UA: NEGATIVE mg/dL
Ketones, ur: NEGATIVE mg/dL
Nitrite: POSITIVE — AB
Protein, ur: 30 mg/dL — AB
Specific Gravity, Urine: 1.018 (ref 1.005–1.030)
Urobilinogen, UA: 1 mg/dL (ref 0.0–1.0)
pH: 5 (ref 5.0–8.0)

## 2014-05-18 LAB — URINE CULTURE
Colony Count: NO GROWTH
Culture: NO GROWTH

## 2014-05-18 LAB — URINE MICROSCOPIC-ADD ON

## 2014-05-18 MED ORDER — CEPHALEXIN 500 MG PO CAPS
1000.0000 mg | ORAL_CAPSULE | Freq: Once | ORAL | Status: AC
  Administered 2014-05-18: 1000 mg via ORAL

## 2014-05-18 MED ORDER — CEPHALEXIN 500 MG PO CAPS
ORAL_CAPSULE | ORAL | Status: AC
Start: 2014-05-18 — End: 2014-05-18
  Filled 2014-05-18: qty 2

## 2014-05-18 NOTE — ED Provider Notes (Signed)
CSN: [id number]     Arrival date & time 05/17/14  2056 History   First MD Initiated Contact with Patient 05/17/14 2102     Chief Complaint  Patient presents with  . Weakness     (Consider location/radiation/quality/duration/timing/severity/associated sxs/prior Treatment) HPI Patient presents to the emergency department with weakness in her legs when trying to get off the toilet earlier this evening.  The patient normally walks with a walker.  She does have some decreased range of motion in her right leg due to previous hip replacement.  The patient states that she has not having any chest pain, shortness of breath, nausea, vomiting, weakness, dizziness, numbness, headache, blurred vision, back pain, neck pain, lightheadedness, near syncope, syncope, or fever. Past Medical History  Diagnosis Date  . Hypertension    Past Surgical History  Procedure Laterality Date  . Fracture surgery      R hip  . Esophageal dilation     No family history on file. History  Substance Use Topics  . Smoking status: Never Smoker   . Smokeless tobacco: Not on file  . Alcohol Use: No   OB History    No data available     Review of Systems   All other systems negative except as documented in the HPI. All pertinent positives and negatives as reviewed in the HPI. Allergies  Review of patient's allergies indicates no known allergies.  Home Medications   Prior to Admission medications   Medication Sig Start Date End Date Taking? Authorizing Provider  beta carotene w/minerals (OCUVITE) tablet Take 1 tablet by mouth daily.   Yes Historical Provider, MD  Cholecalciferol (VITAMIN D3) 2000 UNITS capsule Take 2,000 Units by mouth daily.   Yes Historical Provider, MD  levothyroxine (SYNTHROID, LEVOTHROID) 25 MCG tablet Take 25 mcg by mouth daily before breakfast.   Yes Historical Provider, MD  losartan (COZAAR) 50 MG tablet Take 50 mg by mouth daily.   Yes Historical Provider, MD  Nitrofurantoin Monohyd  Macro (MACROBID PO) Take 50 mg by mouth every evening.   Yes Historical Provider, MD  pantoprazole (PROTONIX) 40 MG tablet Take 40 mg by mouth 2 (two) times daily.   Yes Historical Provider, MD  solifenacin (VESICARE) 10 MG tablet Take 10 mg by mouth daily.   Yes Historical Provider, MD  topiramate (TOPAMAX) 25 MG tablet Take 25 mg by mouth every evening.   Yes Historical Provider, MD   BP 164/64 mmHg  Pulse 67  Temp(Src) 98.1 F (36.7 C) (Oral)  Resp 18  SpO2 97% Physical Exam  Constitutional: She is oriented to person, place, and time. She appears well-developed and well-nourished. No distress.  HENT:  Head: Normocephalic and atraumatic.  Mouth/Throat: Oropharynx is clear and moist.  Eyes: Pupils are equal, round, and reactive to light.  Neck: Normal range of motion. Neck supple.  Cardiovascular: Normal rate, regular rhythm and normal heart sounds.  Exam reveals no gallop and no friction rub.   No murmur heard. Pulmonary/Chest: Effort normal and breath sounds normal. No respiratory distress.  Neurological: She is alert and oriented to person, place, and time. She exhibits normal muscle tone. Coordination normal.  Skin: Skin is warm and dry. No rash noted. No erythema.  Nursing note and vitals reviewed.   ED Course  Procedures (including critical care time) Labs Review Labs Reviewed  CBC - Abnormal; Notable for the following:    RBC 3.39 (*)    Hemoglobin 11.1 (*)    HCT 33.9 (*)  All other components within normal limits  BASIC METABOLIC PANEL - Abnormal; Notable for the following:    Glucose, Bld 100 (*)    BUN 27 (*)    Creatinine, Ser 1.30 (*)    GFR calc non Af Amer 35 (*)    GFR calc Af Amer 40 (*)    All other components within normal limits  URINE CULTURE  TROPONIN I  URINALYSIS, ROUTINE W REFLEX MICROSCOPIC  CBG MONITORING, ED    Imaging Review Dg Chest 2 View  05/17/2014   CLINICAL DATA:  Generalized weakness and shortness of breath. Initial encounter.   EXAM: CHEST  2 VIEW  COMPARISON:  Chest radiograph from 02/13/2008  FINDINGS: The lungs are well-aerated. Mild bibasilar opacities likely reflect atelectasis. Mild peribronchial thickening is seen. There is no evidence of pleural effusion or pneumothorax.  The heart is borderline normal in size. No acute osseous abnormalities are seen.  IMPRESSION: Mild bibasilar opacities likely reflect atelectasis; mild peribronchial thickening seen.   Electronically Signed   By: Garald Balding M.D.   On: 05/17/2014 23:38     MDM   Final diagnoses:  Weakness      Patient be treated for UTI.  She walked with a walker here in the emergency department without difficulty.  Told to return here as needed.  Advised follow-up with her primary care doctor  Brent General, PA-C 05/18/14 0139  Pamella Pert, MD 05/18/14 [phone]

## 2014-05-18 NOTE — ED Notes (Signed)
Pt ambulated using a walker family stated her gate was at baseline. RN and MD notifited

## 2014-05-18 NOTE — Discharge Instructions (Signed)
Return here as needed. Follow up with your doctor for a recheck. °

## 2014-05-20 DIAGNOSIS — M6281 Muscle weakness (generalized): Secondary | ICD-10-CM | POA: Diagnosis not present

## 2014-05-20 DIAGNOSIS — I10 Essential (primary) hypertension: Secondary | ICD-10-CM | POA: Diagnosis not present

## 2014-05-20 DIAGNOSIS — N390 Urinary tract infection, site not specified: Secondary | ICD-10-CM | POA: Diagnosis not present

## 2014-05-21 DIAGNOSIS — N390 Urinary tract infection, site not specified: Secondary | ICD-10-CM | POA: Diagnosis not present

## 2014-05-21 DIAGNOSIS — N3020 Other chronic cystitis without hematuria: Secondary | ICD-10-CM | POA: Diagnosis not present

## 2014-05-21 DIAGNOSIS — I10 Essential (primary) hypertension: Secondary | ICD-10-CM | POA: Diagnosis not present

## 2014-05-22 DIAGNOSIS — M4807 Spinal stenosis, lumbosacral region: Secondary | ICD-10-CM | POA: Diagnosis not present

## 2014-05-23 DIAGNOSIS — M6281 Muscle weakness (generalized): Secondary | ICD-10-CM | POA: Diagnosis not present

## 2014-05-23 DIAGNOSIS — I10 Essential (primary) hypertension: Secondary | ICD-10-CM | POA: Diagnosis not present

## 2014-05-23 DIAGNOSIS — N390 Urinary tract infection, site not specified: Secondary | ICD-10-CM | POA: Diagnosis not present

## 2014-05-25 DIAGNOSIS — I10 Essential (primary) hypertension: Secondary | ICD-10-CM | POA: Diagnosis not present

## 2014-05-25 DIAGNOSIS — M6281 Muscle weakness (generalized): Secondary | ICD-10-CM | POA: Diagnosis not present

## 2014-05-25 DIAGNOSIS — N390 Urinary tract infection, site not specified: Secondary | ICD-10-CM | POA: Diagnosis not present

## 2014-05-28 DIAGNOSIS — M6281 Muscle weakness (generalized): Secondary | ICD-10-CM | POA: Diagnosis not present

## 2014-05-28 DIAGNOSIS — I10 Essential (primary) hypertension: Secondary | ICD-10-CM | POA: Diagnosis not present

## 2014-05-28 DIAGNOSIS — N390 Urinary tract infection, site not specified: Secondary | ICD-10-CM | POA: Diagnosis not present

## 2014-05-29 DIAGNOSIS — M25561 Pain in right knee: Secondary | ICD-10-CM | POA: Diagnosis not present

## 2014-05-29 DIAGNOSIS — M7071 Other bursitis of hip, right hip: Secondary | ICD-10-CM | POA: Diagnosis not present

## 2014-05-30 DIAGNOSIS — I10 Essential (primary) hypertension: Secondary | ICD-10-CM | POA: Diagnosis not present

## 2014-05-30 DIAGNOSIS — M6281 Muscle weakness (generalized): Secondary | ICD-10-CM | POA: Diagnosis not present

## 2014-05-30 DIAGNOSIS — N390 Urinary tract infection, site not specified: Secondary | ICD-10-CM | POA: Diagnosis not present

## 2014-05-31 DIAGNOSIS — I10 Essential (primary) hypertension: Secondary | ICD-10-CM | POA: Diagnosis not present

## 2014-05-31 DIAGNOSIS — N390 Urinary tract infection, site not specified: Secondary | ICD-10-CM | POA: Diagnosis not present

## 2014-05-31 DIAGNOSIS — M6281 Muscle weakness (generalized): Secondary | ICD-10-CM | POA: Diagnosis not present

## 2014-06-01 DIAGNOSIS — N390 Urinary tract infection, site not specified: Secondary | ICD-10-CM | POA: Diagnosis not present

## 2014-06-01 DIAGNOSIS — I10 Essential (primary) hypertension: Secondary | ICD-10-CM | POA: Diagnosis not present

## 2014-06-01 DIAGNOSIS — M6281 Muscle weakness (generalized): Secondary | ICD-10-CM | POA: Diagnosis not present

## 2014-06-04 DIAGNOSIS — I10 Essential (primary) hypertension: Secondary | ICD-10-CM | POA: Diagnosis not present

## 2014-06-04 DIAGNOSIS — M6281 Muscle weakness (generalized): Secondary | ICD-10-CM | POA: Diagnosis not present

## 2014-06-04 DIAGNOSIS — N390 Urinary tract infection, site not specified: Secondary | ICD-10-CM | POA: Diagnosis not present

## 2014-06-05 DIAGNOSIS — N390 Urinary tract infection, site not specified: Secondary | ICD-10-CM | POA: Diagnosis not present

## 2014-06-05 DIAGNOSIS — I10 Essential (primary) hypertension: Secondary | ICD-10-CM | POA: Diagnosis not present

## 2014-06-05 DIAGNOSIS — M6281 Muscle weakness (generalized): Secondary | ICD-10-CM | POA: Diagnosis not present

## 2014-06-06 DIAGNOSIS — M6281 Muscle weakness (generalized): Secondary | ICD-10-CM | POA: Diagnosis not present

## 2014-06-06 DIAGNOSIS — I10 Essential (primary) hypertension: Secondary | ICD-10-CM | POA: Diagnosis not present

## 2014-06-06 DIAGNOSIS — N390 Urinary tract infection, site not specified: Secondary | ICD-10-CM | POA: Diagnosis not present

## 2014-06-07 DIAGNOSIS — M6281 Muscle weakness (generalized): Secondary | ICD-10-CM | POA: Diagnosis not present

## 2014-06-07 DIAGNOSIS — N390 Urinary tract infection, site not specified: Secondary | ICD-10-CM | POA: Diagnosis not present

## 2014-06-07 DIAGNOSIS — I10 Essential (primary) hypertension: Secondary | ICD-10-CM | POA: Diagnosis not present

## 2014-06-12 DIAGNOSIS — N390 Urinary tract infection, site not specified: Secondary | ICD-10-CM | POA: Diagnosis not present

## 2014-06-12 DIAGNOSIS — I10 Essential (primary) hypertension: Secondary | ICD-10-CM | POA: Diagnosis not present

## 2014-06-12 DIAGNOSIS — M6281 Muscle weakness (generalized): Secondary | ICD-10-CM | POA: Diagnosis not present

## 2014-06-14 DIAGNOSIS — N390 Urinary tract infection, site not specified: Secondary | ICD-10-CM | POA: Diagnosis not present

## 2014-06-14 DIAGNOSIS — M6281 Muscle weakness (generalized): Secondary | ICD-10-CM | POA: Diagnosis not present

## 2014-06-14 DIAGNOSIS — I10 Essential (primary) hypertension: Secondary | ICD-10-CM | POA: Diagnosis not present

## 2014-06-16 DIAGNOSIS — I10 Essential (primary) hypertension: Secondary | ICD-10-CM | POA: Diagnosis not present

## 2014-06-16 DIAGNOSIS — M6281 Muscle weakness (generalized): Secondary | ICD-10-CM | POA: Diagnosis not present

## 2014-06-16 DIAGNOSIS — N390 Urinary tract infection, site not specified: Secondary | ICD-10-CM | POA: Diagnosis not present

## 2014-06-18 DIAGNOSIS — M6281 Muscle weakness (generalized): Secondary | ICD-10-CM | POA: Diagnosis not present

## 2014-06-18 DIAGNOSIS — N390 Urinary tract infection, site not specified: Secondary | ICD-10-CM | POA: Diagnosis not present

## 2014-06-18 DIAGNOSIS — I10 Essential (primary) hypertension: Secondary | ICD-10-CM | POA: Diagnosis not present

## 2014-06-20 DIAGNOSIS — I10 Essential (primary) hypertension: Secondary | ICD-10-CM | POA: Diagnosis not present

## 2014-06-20 DIAGNOSIS — N390 Urinary tract infection, site not specified: Secondary | ICD-10-CM | POA: Diagnosis not present

## 2014-06-20 DIAGNOSIS — M6281 Muscle weakness (generalized): Secondary | ICD-10-CM | POA: Diagnosis not present

## 2014-06-25 DIAGNOSIS — I10 Essential (primary) hypertension: Secondary | ICD-10-CM | POA: Diagnosis not present

## 2014-06-25 DIAGNOSIS — M6281 Muscle weakness (generalized): Secondary | ICD-10-CM | POA: Diagnosis not present

## 2014-06-25 DIAGNOSIS — N390 Urinary tract infection, site not specified: Secondary | ICD-10-CM | POA: Diagnosis not present

## 2014-06-26 DIAGNOSIS — M153 Secondary multiple arthritis: Secondary | ICD-10-CM | POA: Diagnosis not present

## 2014-06-26 DIAGNOSIS — R350 Frequency of micturition: Secondary | ICD-10-CM | POA: Diagnosis not present

## 2014-06-26 DIAGNOSIS — I10 Essential (primary) hypertension: Secondary | ICD-10-CM | POA: Diagnosis not present

## 2014-06-26 DIAGNOSIS — G2589 Other specified extrapyramidal and movement disorders: Secondary | ICD-10-CM | POA: Diagnosis not present

## 2014-06-26 DIAGNOSIS — B351 Tinea unguium: Secondary | ICD-10-CM | POA: Diagnosis not present

## 2014-06-29 DIAGNOSIS — M6281 Muscle weakness (generalized): Secondary | ICD-10-CM | POA: Diagnosis not present

## 2014-06-29 DIAGNOSIS — N390 Urinary tract infection, site not specified: Secondary | ICD-10-CM | POA: Diagnosis not present

## 2014-06-29 DIAGNOSIS — I10 Essential (primary) hypertension: Secondary | ICD-10-CM | POA: Diagnosis not present

## 2014-07-05 DIAGNOSIS — R109 Unspecified abdominal pain: Secondary | ICD-10-CM | POA: Diagnosis not present

## 2014-07-08 ENCOUNTER — Emergency Department (HOSPITAL_COMMUNITY): Payer: Medicare Other

## 2014-07-08 ENCOUNTER — Encounter (HOSPITAL_COMMUNITY): Payer: Self-pay | Admitting: Emergency Medicine

## 2014-07-08 ENCOUNTER — Inpatient Hospital Stay (HOSPITAL_COMMUNITY)
Admission: EM | Admit: 2014-07-08 | Discharge: 2014-07-11 | DRG: 392 | Disposition: A | Discharge status: Home / Self Care | Payer: Medicare Other | Attending: Family Medicine | Admitting: Family Medicine

## 2014-07-08 DIAGNOSIS — E039 Hypothyroidism, unspecified: Secondary | ICD-10-CM | POA: Diagnosis present

## 2014-07-08 DIAGNOSIS — K2970 Gastritis, unspecified, without bleeding: Secondary | ICD-10-CM | POA: Diagnosis not present

## 2014-07-08 DIAGNOSIS — Z8781 Personal history of (healed) traumatic fracture: Secondary | ICD-10-CM

## 2014-07-08 DIAGNOSIS — N3020 Other chronic cystitis without hematuria: Secondary | ICD-10-CM | POA: Diagnosis present

## 2014-07-08 DIAGNOSIS — K862 Cyst of pancreas: Secondary | ICD-10-CM | POA: Diagnosis not present

## 2014-07-08 DIAGNOSIS — N3000 Acute cystitis without hematuria: Secondary | ICD-10-CM

## 2014-07-08 DIAGNOSIS — R7309 Other abnormal glucose: Secondary | ICD-10-CM | POA: Diagnosis present

## 2014-07-08 DIAGNOSIS — K529 Noninfective gastroenteritis and colitis, unspecified: Secondary | ICD-10-CM

## 2014-07-08 DIAGNOSIS — K863 Pseudocyst of pancreas: Secondary | ICD-10-CM | POA: Diagnosis present

## 2014-07-08 DIAGNOSIS — F0390 Unspecified dementia without behavioral disturbance: Secondary | ICD-10-CM | POA: Diagnosis present

## 2014-07-08 DIAGNOSIS — N183 Chronic kidney disease, stage 3 (moderate): Secondary | ICD-10-CM | POA: Diagnosis present

## 2014-07-08 DIAGNOSIS — Z886 Allergy status to analgesic agent status: Secondary | ICD-10-CM | POA: Diagnosis not present

## 2014-07-08 DIAGNOSIS — N179 Acute kidney failure, unspecified: Secondary | ICD-10-CM | POA: Diagnosis present

## 2014-07-08 DIAGNOSIS — K219 Gastro-esophageal reflux disease without esophagitis: Secondary | ICD-10-CM | POA: Diagnosis present

## 2014-07-08 DIAGNOSIS — R109 Unspecified abdominal pain: Secondary | ICD-10-CM | POA: Diagnosis not present

## 2014-07-08 DIAGNOSIS — A09 Infectious gastroenteritis and colitis, unspecified: Secondary | ICD-10-CM | POA: Diagnosis not present

## 2014-07-08 DIAGNOSIS — R112 Nausea with vomiting, unspecified: Secondary | ICD-10-CM | POA: Diagnosis not present

## 2014-07-08 DIAGNOSIS — R7302 Impaired glucose tolerance (oral): Secondary | ICD-10-CM | POA: Diagnosis present

## 2014-07-08 DIAGNOSIS — I129 Hypertensive chronic kidney disease with stage 1 through stage 4 chronic kidney disease, or unspecified chronic kidney disease: Secondary | ICD-10-CM | POA: Diagnosis present

## 2014-07-08 DIAGNOSIS — M810 Age-related osteoporosis without current pathological fracture: Secondary | ICD-10-CM | POA: Diagnosis present

## 2014-07-08 HISTORY — DX: Disorder of thyroid, unspecified: E07.9

## 2014-07-08 HISTORY — DX: Noninfective gastroenteritis and colitis, unspecified: K52.9

## 2014-07-08 HISTORY — DX: Gastro-esophageal reflux disease without esophagitis: K21.9

## 2014-07-08 LAB — CBC WITH DIFFERENTIAL/PLATELET
BASOS PCT: 0 % (ref 0–1)
Basophils Absolute: 0 10*3/uL (ref 0.0–0.1)
Eosinophils Absolute: 0.1 10*3/uL (ref 0.0–0.7)
Eosinophils Relative: 1 % (ref 0–5)
HEMATOCRIT: 42.6 % (ref 36.0–46.0)
HEMOGLOBIN: 13.8 g/dL (ref 12.0–15.0)
Lymphocytes Relative: 11 % — ABNORMAL LOW (ref 12–46)
Lymphs Abs: 1.6 10*3/uL (ref 0.7–4.0)
MCH: 32.8 pg (ref 26.0–34.0)
MCHC: 32.4 g/dL (ref 30.0–36.0)
MCV: 101.2 fL — ABNORMAL HIGH (ref 78.0–100.0)
MONO ABS: 1 10*3/uL (ref 0.1–1.0)
MONOS PCT: 7 % (ref 3–12)
NEUTROS PCT: 81 % — AB (ref 43–77)
Neutro Abs: 12.1 10*3/uL — ABNORMAL HIGH (ref 1.7–7.7)
Platelets: 256 10*3/uL (ref 150–400)
RBC: 4.21 MIL/uL (ref 3.87–5.11)
RDW: 13.1 % (ref 11.5–15.5)
WBC: 14.9 10*3/uL — AB (ref 4.0–10.5)

## 2014-07-08 LAB — URINALYSIS, ROUTINE W REFLEX MICROSCOPIC
GLUCOSE, UA: NEGATIVE mg/dL
KETONES UR: NEGATIVE mg/dL
NITRITE: POSITIVE — AB
PH: 5 (ref 5.0–8.0)
PROTEIN: 100 mg/dL — AB
SPECIFIC GRAVITY, URINE: 1.02 (ref 1.005–1.030)
Urobilinogen, UA: 1 mg/dL (ref 0.0–1.0)

## 2014-07-08 LAB — HEPATIC FUNCTION PANEL
ALBUMIN: 2.9 g/dL — AB (ref 3.5–5.2)
ALK PHOS: 50 U/L (ref 39–117)
ALT: 10 U/L (ref 0–35)
AST: 36 U/L (ref 0–37)
BILIRUBIN TOTAL: 1.5 mg/dL — AB (ref 0.3–1.2)
Bilirubin, Direct: 0.4 mg/dL (ref 0.0–0.5)
Indirect Bilirubin: 1.1 mg/dL — ABNORMAL HIGH (ref 0.3–0.9)
Total Protein: 6.7 g/dL (ref 6.0–8.3)

## 2014-07-08 LAB — BASIC METABOLIC PANEL
Anion gap: 9 (ref 5–15)
BUN: 28 mg/dL — ABNORMAL HIGH (ref 6–23)
CO2: 25 mmol/L (ref 19–32)
Calcium: 9.4 mg/dL (ref 8.4–10.5)
Chloride: 106 mmol/L (ref 96–112)
Creatinine, Ser: 1.22 mg/dL — ABNORMAL HIGH (ref 0.50–1.10)
GFR calc Af Amer: 44 mL/min — ABNORMAL LOW (ref >90)
GFR calc non Af Amer: 38 mL/min — ABNORMAL LOW (ref >90)
Glucose, Bld: 151 mg/dL — ABNORMAL HIGH (ref 70–99)
Potassium: 4.7 mmol/L (ref 3.5–5.1)
Sodium: 140 mmol/L (ref 135–145)

## 2014-07-08 LAB — URINE MICROSCOPIC-ADD ON

## 2014-07-08 LAB — LIPASE, BLOOD: LIPASE: 41 U/L (ref 11–59)

## 2014-07-08 LAB — CLOSTRIDIUM DIFFICILE BY PCR: Toxigenic C. Difficile by PCR: NEGATIVE

## 2014-07-08 MED ORDER — OXYCODONE HCL 5 MG PO TABS: 5.0000 mg | ORAL_TABLET | ORAL | Status: DC | PRN

## 2014-07-08 MED ORDER — VITAMIN D 1000 UNITS PO TABS
2000.0000 [IU] | ORAL_TABLET | Freq: Every morning | ORAL | Status: DC
  Administered 2014-07-08 – 2014-07-11 (×4): 2000 [IU] via ORAL
  Filled 2014-07-08 (×4): qty 2

## 2014-07-08 MED ORDER — LATANOPROST 0.005 % OP SOLN
1.0000 [drp] | Freq: Every day | OPHTHALMIC | Status: DC
  Administered 2014-07-08 – 2014-07-10 (×3): 1 [drp] via OPHTHALMIC
  Filled 2014-07-08: qty 2.5

## 2014-07-08 MED ORDER — DARIFENACIN HYDROBROMIDE ER 7.5 MG PO TB24
7.5000 mg | ORAL_TABLET | Freq: Every day | ORAL | Status: DC
  Administered 2014-07-08 – 2014-07-11 (×4): 7.5 mg via ORAL
  Filled 2014-07-08 (×4): qty 1

## 2014-07-08 MED ORDER — CIPROFLOXACIN IN D5W 400 MG/200ML IV SOLN
400.0000 mg | Freq: Once | INTRAVENOUS | Status: AC
Start: 2014-07-08 — End: 2014-07-08
  Administered 2014-07-08: 400 mg via INTRAVENOUS
  Filled 2014-07-08: qty 200

## 2014-07-08 MED ORDER — HEPARIN SODIUM (PORCINE) 5000 UNIT/ML IJ SOLN
5000.0000 [IU] | Freq: Three times a day (TID) | INTRAMUSCULAR | Status: DC
  Administered 2014-07-08 – 2014-07-11 (×9): 5000 [IU] via SUBCUTANEOUS
  Filled 2014-07-08 (×12): qty 1

## 2014-07-08 MED ORDER — IOHEXOL 300 MG/ML  SOLN
80.0000 mL | Freq: Once | INTRAMUSCULAR | Status: AC | PRN
  Administered 2014-07-08: 80 mL via INTRAVENOUS

## 2014-07-08 MED ORDER — METRONIDAZOLE IN NACL 5-0.79 MG/ML-% IV SOLN
500.0000 mg | Freq: Once | INTRAVENOUS | Status: AC
  Administered 2014-07-08: 500 mg via INTRAVENOUS
  Filled 2014-07-08: qty 100

## 2014-07-08 MED ORDER — TOPIRAMATE 25 MG PO TABS
25.0000 mg | ORAL_TABLET | Freq: Every evening | ORAL | Status: DC
  Administered 2014-07-08 – 2014-07-10 (×3): 25 mg via ORAL
  Filled 2014-07-08 (×4): qty 1

## 2014-07-08 MED ORDER — SODIUM CHLORIDE 0.9 % IV SOLN: INTRAVENOUS | Status: DC

## 2014-07-08 MED ORDER — IOHEXOL 300 MG/ML  SOLN
50.0000 mL | Freq: Once | INTRAMUSCULAR | Status: AC | PRN
  Administered 2014-07-08: 50 mL via ORAL

## 2014-07-08 MED ORDER — LEVOTHYROXINE SODIUM 25 MCG PO TABS
25.0000 ug | ORAL_TABLET | Freq: Every day | ORAL | Status: DC
  Administered 2014-07-09 – 2014-07-11 (×3): 25 ug via ORAL
  Filled 2014-07-08 (×4): qty 1

## 2014-07-08 MED ORDER — SODIUM CHLORIDE 0.9 % IV SOLN
1.5000 g | Freq: Two times a day (BID) | INTRAVENOUS | Status: DC
  Administered 2014-07-08 – 2014-07-09 (×3): 1.5 g via INTRAVENOUS
  Filled 2014-07-08 (×4): qty 1.5

## 2014-07-08 MED ORDER — LOSARTAN POTASSIUM 25 MG PO TABS
25.0000 mg | ORAL_TABLET | Freq: Every morning | ORAL | Status: DC
  Administered 2014-07-08 – 2014-07-11 (×4): 25 mg via ORAL
  Filled 2014-07-08 (×4): qty 1

## 2014-07-08 MED ORDER — FENTANYL CITRATE 0.05 MG/ML IJ SOLN
50.0000 ug | Freq: Once | INTRAMUSCULAR | Status: AC
Start: 2014-07-08 — End: 2014-07-08
  Administered 2014-07-08: 50 ug via INTRAVENOUS
  Filled 2014-07-08: qty 2

## 2014-07-08 MED ORDER — DEXTROSE 5 % IV SOLN
1.0000 g | Freq: Once | INTRAVENOUS | Status: AC
  Administered 2014-07-08: 1 g via INTRAVENOUS
  Filled 2014-07-08: qty 10

## 2014-07-08 MED ORDER — SODIUM CHLORIDE 0.9 % IV SOLN
INTRAVENOUS | Status: DC
  Administered 2014-07-08 – 2014-07-09 (×3): via INTRAVENOUS

## 2014-07-08 MED ORDER — MORPHINE SULFATE 2 MG/ML IJ SOLN: 2.0000 mg | INTRAMUSCULAR | Status: DC | PRN

## 2014-07-08 NOTE — ED Notes (Signed)
Dr. Sharol Given at bedside to discuss findings/results with pt and family.

## 2014-07-08 NOTE — Progress Notes (Signed)
Utilization review completed.  

## 2014-07-08 NOTE — Progress Notes (Addendum)
ANTIBIOTIC CONSULT NOTE - INITIAL  Pharmacy Consult for Unasyn Indication: Colitis  Allergies  Allergen Reactions  . Codeine Other (See Comments)    Upset stomach    Patient Measurements: Height: 5\' 5"  (165.1 cm) Weight: 128 lb 4.8 oz (58.196 kg) IBW/kg (Calculated) : 57   Vital Signs: Temp: 97.5 F (36.4 C) (01/24 1535) Temp Source: Oral (01/24 1535) BP: 118/77 mmHg (01/24 1535) Pulse Rate: 91 (01/24 1535) Intake/Output from previous day:   Intake/Output from this shift: Total I/O In: 231.3 [I.V.:231.3] Out: -   Labs:  Recent Labs  07/08/14 0453  WBC 14.9*  HGB 13.8  PLT 256  CREATININE 1.22*   Estimated Creatinine Clearance: 27 mL/min (by C-G formula based on Cr of 1.22). No results for input(s): VANCOTROUGH, VANCOPEAK, VANCORANDOM, GENTTROUGH, GENTPEAK, GENTRANDOM, TOBRATROUGH, TOBRAPEAK, TOBRARND, AMIKACINPEAK, AMIKACINTROU, AMIKACIN in the last 72 hours.   Microbiology: Recent Results (from the past 720 hour(s))  Clostridium Difficile by PCR     Status: None   Collection Time: 07/08/14  8:11 AM  Result Value Ref Range Status   C difficile by pcr NEGATIVE NEGATIVE Final    Comment: Performed at [facility] History: Past Medical History  Diagnosis Date  . Hypertension   . GERD (gastroesophageal reflux disease)   . Thyroid disease    Assessment: 43 y/oF with PMH of HTN, chronic cystitis on suppressive Macrodantin daily, GERD, dementia who presents with lower abdominal pain and diarrhea. C.diff PCR is negative. Pharmacy consulted to dose Unasyn for this patient for colitis.  1/24 >> Cipro >> 1/24 1/24 >> Flagyl >> 1/24 1/24 >> Rocephin >> 1/24 1/24 >> Unasyn >>    Tmax: AF WBCs: elevated, 14.9K Renal: AKI, SCr 1.22, CrCl ~ 27 ml/min CG  1/24 urine: sent 1/24 stool: sent 1/24 C. Diff PCR: negative   Goal of Therapy:  Appropriate antibiotic dosing for renal function and indication Eradication of infection  Plan:    Unasyn 1.5g IV q12h  Monitor renal function, cultures, clinical course.   Lindell Spar, PharmD, BCPS Pager: [phone] 07/08/2014 6:58 PM

## 2014-07-08 NOTE — H&P (Signed)
[facility] History and Physical  Yvette Andrews YQM:[id number] DOB: [birth year] DOA: 07/08/2014  Referring physician: ED-Dr.Otter PCP: Henrine Screws, MD  Specialists:  None yet  Chief Complaint:  Abd pain  HPI:  81 y/o ? known htn,chronic cystitis followed [facility] since 2012 [on suppressive Macrodantin 50 mg every afternoon]-Rx E.coli Pyelonephritis there and Rx 05/21/14 c 7 day course Ciprofloxacin, prior hip # 2009, Jerrye Bushy c reported esoph dilatation, reported mild to moderate dementia per daughter awoke this morning and stated that she needed to go to the restroom. Family member daughter gives most of the history as patient has been given 50 mg of fentanyl and is somewhat sleepy. Ancillary information is that she had seen Dr. Gladstone Lighter 07/05/14 secondary to lower abdominal pain which was thought to be radiating from the right hip and an ultrasound had been ordered which has not yet performed because of the snow and poor climate conditions. She was not having any symptoms at that time Family member states that patient felt weak and nauseous and had a little bit of nonbloody nonbilious emesis. She then stated to the daughter that she felt like she needed to go to the restroom and have stool. Patient fell down which was in assisted fall with the daughter helping her to the ground and daughter could not get her back up. Subsequently EMS was called and patient had a large stool in the EMS truck and then another further 2 stools, one that was witnessed by me as being regular brown but soft consistency and semi-formed. She has not eaten anywhere exotic, no ill contacts at home other than her daughter who has no grandchildren. She complained of significant abdominal pain which is what prompted the IV fentanyl. She does not feel hungry or thirsty at this time. At baseline she has a regular appetite in the last meal that she had as well as some corn soup at around 12 AM Her usual sleep pattern is that she  sleeps at around 3 or 4 in the morning and awakens late. She has a reported history of a colonoscopy more than 10 years ago unknown result, she has had diverticulitis in the remote past and a cholecystectomy in the distant past when daughter was a child. Next line At baseline she is functional with a walker but has somewhat poor memory and cannot remember everything that is going on around her. She is not able to tell the date or time. She does not leave the house often now the cause of her prior hip fracture in 2009  Review of Systems: The patient review of system is suboptimal as patient has just been medicated with fentanyl and is demented and most of the review of systems is taken from patient's daughter  Past Medical History  Diagnosis Date  . Hypertension    Past Surgical History  Procedure Laterality Date  . Fracture surgery      R hip  . Esophageal dilation     Social History:  History   Social History Narrative   Social History    . Marital Status: Widowed    Spouse Name: N/A    Number of Children: N/A    . Years of Education: Did not finish high school      Social History Main Topics    . Smoking status: None    . Smokeless tobacco: None    . Alcohol Use: None    . Drug Use: None    . Sexually Active: None  Currently lives with daughter after daughter's husband passed way for years ago next line no significant family history noted in either mother or father that patient's daughter knows about   No known drug allergies    Allergies  Allergen Reactions  . Codeine Other (See Comments)    Upset stomach    History reviewed. No pertinent family history. 2  Prior to Admission medications   Medication Sig Start Date End Date Taking? Authorizing Provider  beta carotene w/minerals (OCUVITE) tablet Take 1 tablet by mouth daily.   Yes Historical Provider, MD  Cholecalciferol (VITAMIN D3) 2000 UNITS capsule Take 2,000 Units by mouth every morning.    Yes Historical  Provider, MD  levothyroxine (SYNTHROID, LEVOTHROID) 25 MCG tablet Take 25 mcg by mouth daily before breakfast.   Yes Historical Provider, MD  losartan (COZAAR) 50 MG tablet Take 50 mg by mouth every morning.    Yes Historical Provider, MD  LUMIGAN 0.01 % SOLN Place 1 drop into the left eye at bedtime. 07/04/14  Yes Historical Provider, MD  nitrofurantoin (MACRODANTIN) 50 MG capsule Take 50 mg by mouth daily. 06/14/14  Yes Historical Provider, MD  oxyCODONE-acetaminophen (PERCOCET/ROXICET) 5-325 MG per tablet Take 1 tablet by mouth 3 (three) times daily as needed. For pain. 07/04/14  Yes Historical Provider, MD  pantoprazole (PROTONIX) 40 MG tablet Take 40 mg by mouth daily.    Yes Historical Provider, MD  solifenacin (VESICARE) 10 MG tablet Take 10 mg by mouth daily.   Yes Historical Provider, MD  topiramate (TOPAMAX) 25 MG tablet Take 25 mg by mouth every evening.   Yes Historical Provider, MD   Physical Exam: Filed Vitals:   07/08/14 0412 07/08/14 0423 07/08/14 0642  BP:  115/54 117/60  Pulse:  79 88  Temp:  98 F (36.7 C)   TempSrc:  Oral   Resp:  18 18  Height:  5\' 5"  (1.651 m)   Weight:  56.246 kg (124 lb)   SpO2: 100% 96% 98%     General:  Alert to some extent oriented only to herself she does not recognize her daughter  Eyes: Cataract left eye, no pallor, dry mucosa  ENT: Very dry mucosa, poor dentition, tongue is dry without any changes, no JVD, no lymphadenopathy noted  Neck: Soft supple  Cardiovascular: S1-S2 no murmur rub or gallop  Respiratory: Clinically clear  Abdomen: Tender in lower quadrant with some upper quadrant discomfort. No rebound no guarding. No lower extremity edema however there is some thinning and discomfort to the skin  Skin: Mild sacral redness  Musculoskeletal: Range of motion intact  Psychiatric: Flat affect, slightly confused  Neurologic: Grossly intact moves all 4 limbs equally without deficit-rest of her exam is truncated as she does not  cooperate and can become combative  Labs on Admission:  Basic Metabolic Panel:  Recent Labs Lab 07/08/14 0453  NA 140  K 4.7  CL 106  CO2 25  GLUCOSE 151*  BUN 28*  CREATININE 1.22*  CALCIUM 9.4   Liver Function Tests:  Recent Labs Lab 07/08/14 0453  AST 36  ALT 10  ALKPHOS 50  BILITOT 1.5*  PROT 6.7  ALBUMIN 2.9*    Recent Labs Lab 07/08/14 0453  LIPASE 41   No results for input(s): AMMONIA in the last 168 hours. CBC:  Recent Labs Lab 07/08/14 0453  WBC 14.9*  NEUTROABS 12.1*  HGB 13.8  HCT 42.6  MCV 101.2*  PLT 256   Cardiac Enzymes: No results for  input(s): CKTOTAL, CKMB, CKMBINDEX, TROPONINI in the last 168 hours.  BNP (last 3 results) No results for input(s): PROBNP in the last 8760 hours. CBG: No results for input(s): GLUCAP in the last 168 hours.  Radiological Exams on Admission: Ct Abdomen Pelvis W Contrast  07/08/2014   CLINICAL DATA:  Low abdominal pain with nausea for 5 days.  EXAM: CT ABDOMEN AND PELVIS WITH CONTRAST  TECHNIQUE: Multidetector CT imaging of the abdomen and pelvis was performed using the standard protocol following bolus administration of intravenous contrast.  CONTRAST:  60mL OMNIPAQUE IOHEXOL 300 MG/ML SOLN, 63mL OMNIPAQUE IOHEXOL 300 MG/ML SOLN  COMPARISON:  None.  FINDINGS: BODY WALL: Unremarkable.  LOWER CHEST: Subpleural opacities in the right lower lobe are elongated and most consistent with atelectasis or scar. There is a trace right pleural effusion.  ABDOMEN/PELVIS:  Liver: No focal abnormality.  Biliary: Cholecystectomy.  No calcified choledocholithiasis.  Pancreas: The pancreatic head is mildly enlarged and edematous and there is retroperitoneal edema throughout the upper abdominal ligaments and mesenteries. Ovoid simple appearing cyst present in the pancreatic body measuring 22 mm. This is near the main duct, which is not dilated.  Spleen: Unremarkable.  Adrenals: Unremarkable.  Kidneys and ureters: Bilateral renal  cortical thinning and multiple cortical cysts. No hydronephrosis or urolithiasis.  Bladder: Thick walled appearance with prominent mucosal hyper enhancement. The patient has of current urinalysis.  Reproductive: Normal atrophic appearance of the uterus. The right ovary contains a 16 mm cyst which is considered incidental.  Bowel: There is circumferential thickening and mucosal hyper enhancement of the distal colon. Given the rectum is involved, ischemic colitis is less likely than infectious colitis. No bowel obstruction. No pericecal inflammation. There is extensive distal colonic diverticulosis without focal inflammation.  Retroperitoneum: Diffuse retroperitoneal edema which could be a combination of volume overload and reactive edema  Peritoneum: No ascites or pneumoperitoneum.  Vascular: Diffuse atherosclerosis of the aorta and branch vessels. There is no major vessel occlusion to explain the colonic.  OSSEOUS: Simple lipoma again noted along the right iliopsoas. Remote intertrochanteric right femur fracture status post repair.  IMPRESSION: 1. Distal colitis, pattern favoring infectious over ischemic cause. 2. Cystitis, correlate with urinalysis. 3. Possible pancreatitis.  Please check serum pancreas enzymes. 4. 22 mm pancreatic cyst, favor pseudocyst. A 1 year followup CT could document stability.   Electronically Signed   By: Jorje Guild M.D.   On: 07/08/2014 06:17    EKG: Independently reviewed. None performed  Assessment/Plan Principal Problem:   Colitis, acute-unclear etiology-my concern is primarily that this may be C. difficile colitis as she just received a week's course of ciprofloxacin, is on chronic suppressive Macrodantin and is on Protonix. I will get a C. difficile PCR stat and if this comes back positive we will continue her on Flagyl monotherapy if not we will continue IV Cipro and Flagyl.  Will consider florastor. Until we know the etiology of her diarrhea, we will hold any  antimotility agents. I will get a stool culture and ova cyst and parasites for completion sake. This could just also be a simple diverticulitis-so we will also get a stool culture and fecal lactoferrin to rule out bacterial infection We will reexamine her and reassess her dependent on the amount of pain she has Active Problems:   Pancreatic pseudocyst-probably unrelated to present issue.  Interval screening. Of course if her LFTs in the morning are elevated, I will consult the appropriate specialist to gastroenterology versus general surgery however at this time  she only has a mild hyperbilirubinemia of 1.5 and we will follow this closely   AKI (acute kidney injury)-patient's baseline BUN/creatinine is 14/0.63 her current is 28/1.2-BACK her home dose of losartan 52 losartan 25 mg based on JNC 7 guidelines of the pressures above 150/90 being appropriate for this age group   Impaired glucose metabolism-her CBG accounts for maybe an A1c of less than 6. I would not check aggressively   Chronic cystitis followed by Dr. Tresa Endo [facility] since 04/2011-I will hold all of her suppressive antibiotics as well as her Protonix for right now. If needed I will add back the Protonix as this is a risk factor. See #1. I will place her on Vesicare equivalent Enablex 7.5 mg daily   History of hip fracture, 2009, Dr. Gioffre-currently stable   At least moderate dementia-patient needs redirection and if needed we will have a sitter as she was a little bit aggressive this morning and threatening to hit this examiner.    Nita Sells [facility] Pager [phone]  If 7PM-7AM, please contact night-coverage [email or web address] Password [email or web address] 07/08/2014, 7:39 AM

## 2014-07-08 NOTE — ED Notes (Signed)
Awake. Verbally responsive. Resp even and unlabored. No audible adventitious breath sounds noted. ABC's intact. Abd soft/nondistended but tender to palpate. BS (+) and active x4 quadrants. No N/V/D reported. Pt had 2 med to lg soft BM's since arrival. Buttocks red.

## 2014-07-08 NOTE — ED Notes (Signed)
Patient transported to CT 

## 2014-07-08 NOTE — ED Provider Notes (Signed)
CSN: [id number]     Arrival date & time 07/08/14  0410 History   First MD Initiated Contact with Patient 07/08/14 [phone]     Chief Complaint  Patient presents with  . Abdominal Pain     (Consider location/radiation/quality/duration/timing/severity/associated sxs/prior Treatment) HPI 81 year old female presents to the emergency department via EMS from home with complaint of abdominal pain and nausea and vomiting.  Patient having pain earlier in the week near her right hip, seen by her orthopedist as she is status post right hip surgery in 2009 for intertrochanteric fracture.  Dr. Matilde Haymaker felt pain more in abdomen and ordered an ultrasound for further evaluation as an outpatient.  Due to the weather, they have not yet receiveda scheduled time to have this ultrasound done.  Patient seen by her primary care doctor with the last 2 weeks and had normal workup at that time.  Pt had UTI in December, seen in ED on 12/3 and started on Keflex.  Seen by urology at [facility] due to frequent UTI, and told to stop Keflex.  Urine culture on 12/3 negative, Seen at [facility] on 12/7 had ecoli culture, placed on Cipro for 7 days.  Recently, patient has been eating and drinking well.  She denies any urinary symptoms.  Tonight, patient was going to the bathroom, being assisted by her daughter when she developed abdominal pain and nausea.  Patient became weak and sank to the floor.  Daughter reported that she was less responsive than usual, but was better after sitting on the floor.  Patient has had loose stool, nausea and vomiting.  No fevers.  She is status post cholecystectomy.  She has history of diverticulitis.  Patient denies any right sided abdominal pain at this time, only epigastric and left sided.  Patient is unsure if she had her appendix removed, denies hysterectomy Past Medical History  Diagnosis Date  . Hypertension    Past Surgical History  Procedure Laterality Date  . Fracture surgery      R hip  .  Esophageal dilation     History reviewed. No pertinent family history. History  Substance Use Topics  . Smoking status: Never Smoker   . Smokeless tobacco: Not on file  . Alcohol Use: No   OB History    No data available     Review of Systems   See History of Present Illness; otherwise all other systems are reviewed and negative  Allergies  Review of patient's allergies indicates no known allergies.  Home Medications   Prior to Admission medications   Medication Sig Start Date End Date Taking? Authorizing Provider  beta carotene w/minerals (OCUVITE) tablet Take 1 tablet by mouth daily.    Historical Provider, MD  Cholecalciferol (VITAMIN D3) 2000 UNITS capsule Take 2,000 Units by mouth daily.    Historical Provider, MD  levothyroxine (SYNTHROID, LEVOTHROID) 25 MCG tablet Take 25 mcg by mouth daily before breakfast.    Historical Provider, MD  losartan (COZAAR) 50 MG tablet Take 50 mg by mouth daily.    Historical Provider, MD  Nitrofurantoin Monohyd Macro (MACROBID PO) Take 50 mg by mouth every evening.    Historical Provider, MD  pantoprazole (PROTONIX) 40 MG tablet Take 40 mg by mouth 2 (two) times daily.    Historical Provider, MD  solifenacin (VESICARE) 10 MG tablet Take 10 mg by mouth daily.    Historical Provider, MD  topiramate (TOPAMAX) 25 MG tablet Take 25 mg by mouth every evening.    Historical Provider, MD  BP 115/54 mmHg  Pulse 79  Temp(Src) 98 F (36.7 C) (Oral)  Resp 18  Ht 5\' 5"  (1.651 m)  Wt 124 lb (56.246 kg)  BMI 20.63 kg/m2  SpO2 96% Physical Exam  Constitutional: She is oriented to person, place, and time. She appears well-developed and well-nourished.  Frail, elderly female, awake and alert  HENT:  Head: Normocephalic and atraumatic.  Nose: Nose normal.  Mouth/Throat: Oropharynx is clear and moist.  Eyes: Conjunctivae and EOM are normal. Pupils are equal, round, and reactive to light.  Neck: Normal range of motion. Neck supple. No JVD present.  No tracheal deviation present. No thyromegaly present.  Cardiovascular: Normal rate, regular rhythm, normal heart sounds and intact distal pulses.  Exam reveals no gallop and no friction rub.   No murmur heard. Pulmonary/Chest: Effort normal and breath sounds normal. No stridor. No respiratory distress. She has no wheezes. She has no rales. She exhibits no tenderness.  Abdominal: Soft. Bowel sounds are normal. She exhibits no distension and no mass. There is tenderness (patient has significant tenderness in epigastrium, left upper quadrant and suprapubic region.  She has no Murphy sign or pain over McBurney's point.). There is no rebound and no guarding.  Musculoskeletal: Normal range of motion. She exhibits no edema or tenderness.  Lymphadenopathy:    She has no cervical adenopathy.  Neurological: She is alert and oriented to person, place, and time. She displays normal reflexes. She exhibits normal muscle tone. Coordination normal.  Skin: Skin is warm and dry. No rash noted. No erythema. No pallor.  Psychiatric: She has a normal mood and affect. Her behavior is normal. Judgment and thought content normal.  Nursing note and vitals reviewed.   ED Course  Procedures (including critical care time) Labs Review Labs Reviewed  URINALYSIS, ROUTINE W REFLEX MICROSCOPIC - Abnormal; Notable for the following:    Color, Urine AMBER (*)    APPearance TURBID (*)    Hgb urine dipstick MODERATE (*)    Bilirubin Urine SMALL (*)    Protein, ur 100 (*)    Nitrite POSITIVE (*)    Leukocytes, UA LARGE (*)    All other components within normal limits  BASIC METABOLIC PANEL - Abnormal; Notable for the following:    Glucose, Bld 151 (*)    BUN 28 (*)    Creatinine, Ser 1.22 (*)    GFR calc non Af Amer 38 (*)    GFR calc Af Amer 44 (*)    All other components within normal limits  CBC WITH DIFFERENTIAL/PLATELET - Abnormal; Notable for the following:    WBC 14.9 (*)    MCV 101.2 (*)    Neutrophils  Relative % 81 (*)    Neutro Abs 12.1 (*)    Lymphocytes Relative 11 (*)    All other components within normal limits  HEPATIC FUNCTION PANEL - Abnormal; Notable for the following:    Albumin 2.9 (*)    Total Bilirubin 1.5 (*)    Indirect Bilirubin 1.1 (*)    All other components within normal limits  URINE CULTURE  URINE MICROSCOPIC-ADD ON  LIPASE, BLOOD    Imaging Review Ct Abdomen Pelvis W Contrast  07/08/2014   CLINICAL DATA:  Low abdominal pain with nausea for 5 days.  EXAM: CT ABDOMEN AND PELVIS WITH CONTRAST  TECHNIQUE: Multidetector CT imaging of the abdomen and pelvis was performed using the standard protocol following bolus administration of intravenous contrast.  CONTRAST:  29mL OMNIPAQUE IOHEXOL 300 MG/ML SOLN,  59mL OMNIPAQUE IOHEXOL 300 MG/ML SOLN  COMPARISON:  None.  FINDINGS: BODY WALL: Unremarkable.  LOWER CHEST: Subpleural opacities in the right lower lobe are elongated and most consistent with atelectasis or scar. There is a trace right pleural effusion.  ABDOMEN/PELVIS:  Liver: No focal abnormality.  Biliary: Cholecystectomy.  No calcified choledocholithiasis.  Pancreas: The pancreatic head is mildly enlarged and edematous and there is retroperitoneal edema throughout the upper abdominal ligaments and mesenteries. Ovoid simple appearing cyst present in the pancreatic body measuring 22 mm. This is near the main duct, which is not dilated.  Spleen: Unremarkable.  Adrenals: Unremarkable.  Kidneys and ureters: Bilateral renal cortical thinning and multiple cortical cysts. No hydronephrosis or urolithiasis.  Bladder: Thick walled appearance with prominent mucosal hyper enhancement. The patient has of current urinalysis.  Reproductive: Normal atrophic appearance of the uterus. The right ovary contains a 16 mm cyst which is considered incidental.  Bowel: There is circumferential thickening and mucosal hyper enhancement of the distal colon. Given the rectum is involved, ischemic colitis is  less likely than infectious colitis. No bowel obstruction. No pericecal inflammation. There is extensive distal colonic diverticulosis without focal inflammation.  Retroperitoneum: Diffuse retroperitoneal edema which could be a combination of volume overload and reactive edema  Peritoneum: No ascites or pneumoperitoneum.  Vascular: Diffuse atherosclerosis of the aorta and branch vessels. There is no major vessel occlusion to explain the colonic.  OSSEOUS: Simple lipoma again noted along the right iliopsoas. Remote intertrochanteric right femur fracture status post repair.  IMPRESSION: 1. Distal colitis, pattern favoring infectious over ischemic cause. 2. Cystitis, correlate with urinalysis. 3. Possible pancreatitis.  Please check serum pancreas enzymes. 4. 22 mm pancreatic cyst, favor pseudocyst. A 1 year followup CT could document stability.   Electronically Signed   By: Jorje Guild M.D.   On: 07/08/2014 06:17     EKG Interpretation None      MDM   Final diagnoses:  Acute cystitis without hematuria  Colitis presumed infectious  Pancreatic cyst    81 year old female with acute onset of upper abdominal pain, nausea and vomiting tonight, some pain on the right side earlier in the week.  Plan for labs, urine, CT abdomen pelvis.  6:51 AM Urine positive for acute cystitis, CT scan with inflammation of distal colon and rectum concerning for colitis.  Infectious in nature.  Patient has some retroperitoneal edema as well.  Patient has 22 mm pancreatic cyst with some inflammation, but lipase is normal.  Plan to consult medicine for admission.  Patient received Rocephin when urine came back grossly abnormal, will now switch to Cipro and Flagyl, given colitis.  Patient has taken Cipro in the past without difficulty.    Kalman Drape, MD 07/08/14 [phone]

## 2014-07-08 NOTE — ED Notes (Addendum)
Pt arrived via EMS with c/o lower abd pain. Pt been having pain since Tuesday in RLQ and f/u MD setup for her to have an [place].  Pt reported having nausea without vomiting and no diarrhea. She was given Zofran 4mg  IV en-route.

## 2014-07-08 NOTE — ED Notes (Signed)
Provided pt family member with a sandwich and coffee

## 2014-07-08 NOTE — ED Notes (Signed)
Bed: WA04 Expected date:  Expected time:  Means of arrival:  Comments: EMS 75 F abd pain

## 2014-07-09 LAB — COMPREHENSIVE METABOLIC PANEL
ALK PHOS: 43 U/L (ref 39–117)
ALT: 8 U/L (ref 0–35)
ANION GAP: 7 (ref 5–15)
AST: 17 U/L (ref 0–37)
Albumin: 2.4 g/dL — ABNORMAL LOW (ref 3.5–5.2)
BUN: 19 mg/dL (ref 6–23)
CALCIUM: 8.2 mg/dL — AB (ref 8.4–10.5)
CHLORIDE: 109 mmol/L (ref 96–112)
CO2: 24 mmol/L (ref 19–32)
Creatinine, Ser: 1.14 mg/dL — ABNORMAL HIGH (ref 0.50–1.10)
GFR, EST AFRICAN AMERICAN: 47 mL/min — AB (ref >90)
GFR, EST NON AFRICAN AMERICAN: 41 mL/min — AB (ref >90)
Glucose, Bld: 100 mg/dL — ABNORMAL HIGH (ref 70–99)
Potassium: 4.2 mmol/L (ref 3.5–5.1)
Sodium: 140 mmol/L (ref 135–145)
Total Bilirubin: 0.4 mg/dL (ref 0.3–1.2)
Total Protein: 5.1 g/dL — ABNORMAL LOW (ref 6.0–8.3)

## 2014-07-09 LAB — CBC
HCT: 32.1 % — ABNORMAL LOW (ref 36.0–46.0)
HEMOGLOBIN: 10.4 g/dL — AB (ref 12.0–15.0)
MCH: 32.7 pg (ref 26.0–34.0)
MCHC: 32.4 g/dL (ref 30.0–36.0)
MCV: 100.9 fL — ABNORMAL HIGH (ref 78.0–100.0)
Platelets: 195 10*3/uL (ref 150–400)
RBC: 3.18 MIL/uL — ABNORMAL LOW (ref 3.87–5.11)
RDW: 13.2 % (ref 11.5–15.5)
WBC: 9.6 10*3/uL (ref 4.0–10.5)

## 2014-07-09 LAB — PROTIME-INR
INR: 1.23 (ref 0.00–1.49)
Prothrombin Time: 15.6 seconds — ABNORMAL HIGH (ref 11.6–15.2)

## 2014-07-09 NOTE — Evaluation (Signed)
Physical Therapy Evaluation Patient Details Name: Yvette Andrews MRN: [id number] DOB: 09-05-1920 Today's Date: 07/09/2014   History of Present Illness  Pt admitted with colitis.  Clinical Impression  Pt admitted with above diagnosis. Pt currently with functional limitations due to the deficits listed below (see PT Problem List).  Pt will benefit from skilled PT to increase their independence and safety with mobility to allow discharge to the venue listed below.  Pt would benefit from HHPT upon d/c from acute care.  Pt's daughter in agreement.     Follow Up Recommendations Home health PT    Equipment Recommendations  None recommended by PT    Recommendations for Other Services       Precautions / Restrictions        Mobility  Bed Mobility Overal bed mobility: Needs Assistance Bed Mobility: Supine to Sit     Supine to sit: Min assist     General bed mobility comments: Pt used PT's hand as leverage to pull trunk up.  Transfers Overall transfer level: Needs assistance Equipment used: Rolling walker (2 wheeled) Transfers: Sit to/from [facility] Sit to Stand: Mod assist Stand pivot transfers: Min assist       General transfer comment: Pt stood to RW and needed multi-modal cueing for hand placement.  Used RW to pivot to [facility] and then later [facility] to recliner. Deferred gait due to active diarrhea.  Ambulation/Gait                Stairs            Wheelchair Mobility    Modified Rankin (Stroke Patients Only)       Balance Overall balance assessment: Needs assistance         Standing balance support: Bilateral upper extremity supported Standing balance-Leahy Scale: Poor Standing balance comment: Pt able to stand at RW while she was cleaned after using BSC.  Flexed posture.                             Pertinent Vitals/Pain Pain Assessment: No/denies pain    Home Living Family/patient expects to be discharged to:: Private  residence Living Arrangements: Children Available Help at Discharge: Family;Available PRN/intermittently;Other (Comment) (close to 24 hours/day) Type of Home: House Home Access: Ramped entrance     Home Layout: One level Home Equipment: Walker - 2 wheels;Wheelchair - [employer];Shower seat (a transport chair)      Prior Function Level of Independence: Independent with assistive device(s)         Comments: amb short distances in house with RW.  Pt and daughter go on trips with last trip to [place] in October.  Hoping to go at end of March if pt is able.  She uses wheelchair while on vacations.     Hand Dominance        Extremity/Trunk Assessment   Upper Extremity Assessment: Defer to OT evaluation           Lower Extremity Assessment: Generalized weakness         Communication   Communication: No difficulties  Cognition Arousal/Alertness: Awake/alert Behavior During Therapy: WFL for tasks assessed/performed Overall Cognitive Status: History of cognitive impairments - at baseline                      General Comments General comments (skin integrity, edema, etc.): Daughter present and supportive.    Exercises  Assessment/Plan    PT Assessment Patient needs continued PT services  PT Diagnosis Difficulty walking   PT Problem List Decreased strength;Decreased activity tolerance;Decreased balance;Decreased mobility  PT Treatment Interventions Gait training;Functional mobility training;Therapeutic activities;Therapeutic exercise;Balance training   PT Goals (Current goals can be found in the Care Plan section) Acute Rehab PT Goals Patient Stated Goal: Daughter is hoping pt gets stronger so they can take a trip to [place] at the end of March. PT Goal Formulation: With patient/family Time For Goal Achievement: 07/23/14 Potential to Achieve Goals: Good    Frequency Min 3X/week   Barriers to discharge        Co-evaluation                End of Session Equipment Utilized During Treatment: Gait belt Activity Tolerance: Patient tolerated treatment well Patient left: in chair;with family/visitor present;with call bell/phone within reach           Time: 1154-1224 PT Time Calculation (min) (ACUTE ONLY): 30 min   Charges:   PT Evaluation $Initial PT Evaluation Tier I: 1 Procedure PT Treatments $Therapeutic Activity: 8-22 mins   PT G Codes:        Sims Laday LUBECK 07/09/2014, 1:55 PM

## 2014-07-09 NOTE — Progress Notes (Signed)
Yvette Andrews UYQ:[id number] DOB: 10/06/1920 DOA: 07/08/2014 PCP: Henrine Screws, MD  Brief narrative: 81 y/o ? chronic Pyelo followed [place] on suppressive Macrodantin and recent Rx Cipro in 05/2014, Prior hip # 2009, GERD + Eso dilatation, prior diverticulitis, Mod dementia  Past medical history-As per Problem list Chart reviewed as below-  Consultants:  none  Procedures:  none  Antibiotics:  Cipro 1/25-1/25  Flagyll 1/25-1/25  Unasayn 1/25   Subjective  Alert More interactive 2 loose stool today No fever Ready to have a more solid diet No cp no sob   Objective    Interim History:   Telemetry:    Objective: Filed Vitals:   07/08/14 1535 07/08/14 2018 07/09/14 0445 07/09/14 0604  BP: 118/77 113/61 111/45 127/55  Pulse: 91 76 76   Temp: 97.5 F (36.4 C) 98.1 F (36.7 C) 98.3 F (36.8 C)   TempSrc: Oral Oral Oral   Resp: 18 16 16    Height:      Weight:      SpO2: 98% 97% 97%     Intake/Output Summary (Last 24 hours) at 07/09/14 1312 Last data filed at 07/09/14 0558  Gross per 24 hour  Intake 2379.25 ml  Output      0 ml  Net 2379.25 ml    Exam:  General: EOMI, NCAT Cardiovascular:  s1 s 2no m/r/g Respiratory: clear, no added sound Abdomen:  Soft, slightly tender epigastrium Skin no Le edema Neuro power intact  Data Reviewed: Basic Metabolic Panel:  Recent Labs Lab 07/08/14 0453 07/09/14 0437  NA 140 140  K 4.7 4.2  CL 106 109  CO2 25 24  GLUCOSE 151* 100*  BUN 28* 19  CREATININE 1.22* 1.14*  CALCIUM 9.4 8.2*   Liver Function Tests:  Recent Labs Lab 07/08/14 0453 07/09/14 0437  AST 36 17  ALT 10 8  ALKPHOS 50 43  BILITOT 1.5* 0.4  PROT 6.7 5.1*  ALBUMIN 2.9* 2.4*    Recent Labs Lab 07/08/14 0453  LIPASE 41   No results for input(s): AMMONIA in the last 168 hours. CBC:  Recent Labs Lab 07/08/14 0453 07/09/14 0437  WBC 14.9* 9.6  NEUTROABS 12.1*  --   HGB 13.8 10.4*  HCT 42.6 32.1*  MCV 101.2*  100.9*  PLT 256 195   Cardiac Enzymes: No results for input(s): CKTOTAL, CKMB, CKMBINDEX, TROPONINI in the last 168 hours. BNP: Invalid input(s): POCBNP CBG: No results for input(s): GLUCAP in the last 168 hours.  Recent Results (from the past 240 hour(s))  Urine culture     Status: None (Preliminary result)   Collection Time: 07/08/14  5:07 AM  Result Value Ref Range Status   Specimen Description URINE, CATHETERIZED  Final   Special Requests NONE  Final   Colony Count PENDING  Incomplete   Culture   Final    Culture reincubated for better growth Performed at [facility]    Report Status PENDING  Incomplete  Clostridium Difficile by PCR     Status: None   Collection Time: 07/08/14  8:11 AM  Result Value Ref Range Status   C difficile by pcr NEGATIVE NEGATIVE Final    Comment: Performed at [facility]     Studies:              All Imaging reviewed and is as per above notation   Scheduled Meds: . ampicillin-sulbactam (UNASYN) IV  1.5 g Intravenous Q12H  . cholecalciferol  2,000 Units Oral q morning -  10a  . darifenacin  7.5 mg Oral Daily  . heparin  5,000 Units Subcutaneous 3 times per day  . latanoprost  1 drop Left Eye QHS  . levothyroxine  25 mcg Oral QAC breakfast  . losartan  25 mg Oral q morning - 10a  . topiramate  25 mg Oral QPM   Continuous Infusions: . sodium chloride 75 mL/hr at 07/08/14 2200     Assessment/Plan: 1. Acute undifferentiated colitis-Cdiff PCR neg.  Await Stool cult.  As taking PO well, NSL.  Abd pain seems reasonable controlled.   Cipro/flagyll to Unasyn Monotherapy-transition to PO in am 1/26 2. Pancreatic pseudocyst-OP GI f/u-Bili 1.5-->0.4 3. AKI, Ck-Glt Equation CKD stg 3-4 on admit.  Improved with IV saline-2/2 to Diarrhea as well as use of ARB.  NSL on 07/09/14 4. Hypothryoid-cont levothyroxine 25 q am 5. MOd dementia at least-[place] OP Aricept/Namenda per PCP at next MMSE 6. Caution c Darifenacin as can cause  confusion 7. Expected osteoporosis-Continue Cholecalciferol  2000 q am 8. Htn-continue Losartan lower dose 25 mg daily   Code Status: full Family Communication:  Discussed daughter bedsdie Disposition Plan: inpatient ~24-36 hrs more needed   Verneita Griffes, MD  [facility] Pager [phone] 07/09/2014, 1:12 PM    LOS: 1 day

## 2014-07-10 LAB — FECAL LACTOFERRIN, QUANT: FECAL LACTOFERRIN: POSITIVE

## 2014-07-10 MED ORDER — AMOXICILLIN-POT CLAVULANATE 875-125 MG PO TABS: 1.0000 | ORAL_TABLET | Freq: Two times a day (BID) | ORAL | Status: DC

## 2014-07-10 MED ORDER — AMOXICILLIN-POT CLAVULANATE 500-125 MG PO TABS
1.0000 | ORAL_TABLET | Freq: Two times a day (BID) | ORAL | Status: DC
  Administered 2014-07-10 – 2014-07-11 (×3): 500 mg via ORAL
  Filled 2014-07-10 (×4): qty 1

## 2014-07-10 NOTE — Progress Notes (Addendum)
Yvette Andrews [id number] DOB: 1921-05-31 DOA: 07/08/2014 PCP: Henrine Screws, MD  Brief narrative: 81 y/o ? chronic Pyelo followed [place] on suppressive Macrodantin and recent Rx Cipro in 05/2014, Prior hip # 2009, GERD + Eso dilatation, prior diverticulitis, Mod dementia  Past medical history-As per Problem list Chart reviewed as below-  Consultants:  none  Procedures:  none  Antibiotics:  Cipro 1/25-1/25  Flagyll 1/25-1/25  Unasayn 1/25-1/26  Augmentin 1/26---->07/16/14 = 7 days treatment   Subjective   no loose stools Eating and drinking fairly well   Objective    Interim History:   Telemetry:    Objective: Filed Vitals:   07/09/14 1409 07/09/14 2138 07/10/14 0554 07/10/14 1440  BP: 113/55 131/65 128/50 120/59  Pulse: 66 71 64 68  Temp: 97.5 F (36.4 C) 98 F (36.7 C) 98.3 F (36.8 C) 98.5 F (36.9 C)  TempSrc: Oral Oral Oral Oral  Resp: '16 16 16 18  ' Height:      Weight:      SpO2: 98% 95% 95% 97%    Intake/Output Summary (Last 24 hours) at 07/10/14 1659 Last data filed at 07/10/14 1459  Gross per 24 hour  Intake    830 ml  Output      0 ml  Net    830 ml    Exam:  General: EOMI, NCAT Cardiovascular:  s1 s 2no m/r/g Respiratory: clear, no added sound Abdomen:  Soft, slightly tender epigastrium Skin no Le edema Neuro power intact  Data Reviewed: Basic Metabolic Panel:  Recent Labs Lab 07/08/14 0453 07/09/14 0437  NA 140 140  K 4.7 4.2  CL 106 109  CO2 25 24  GLUCOSE 151* 100*  BUN 28* 19  CREATININE 1.22* 1.14*  CALCIUM 9.4 8.2*   Liver Function Tests:  Recent Labs Lab 07/08/14 0453 07/09/14 0437  AST 36 17  ALT 10 8  ALKPHOS 50 43  BILITOT 1.5* 0.4  PROT 6.7 5.1*  ALBUMIN 2.9* 2.4*    Recent Labs Lab 07/08/14 0453  LIPASE 41   No results for input(s): AMMONIA in the last 168 hours. CBC:  Recent Labs Lab 07/08/14 0453 07/09/14 0437  WBC 14.9* 9.6  NEUTROABS 12.1*  --   HGB 13.8 10.4*  HCT  42.6 32.1*  MCV 101.2* 100.9*  PLT 256 195   Cardiac Enzymes: No results for input(s): CKTOTAL, CKMB, CKMBINDEX, TROPONINI in the last 168 hours. BNP: Invalid input(s): POCBNP CBG: No results for input(s): GLUCAP in the last 168 hours.  Recent Results (from the past 240 hour(s))  Urine culture     Status: None (Preliminary result)   Collection Time: 07/08/14  5:07 AM  Result Value Ref Range Status   Specimen Description URINE, CATHETERIZED  Final   Special Requests NONE  Final   Colony Count   Final    >=100,000 COLONIES/ML Performed at [facility]    Culture   Final    ESCHERICHIA COLI Performed at [facility]    Report Status PENDING  Incomplete  Clostridium Difficile by PCR     Status: None   Collection Time: 07/08/14  8:11 AM  Result Value Ref Range Status   C difficile by pcr NEGATIVE NEGATIVE Final    Comment: Performed at [facility]  Stool culture     Status: None (Preliminary result)   Collection Time: 07/08/14  8:25 AM  Result Value Ref Range Status   Specimen Description STOOL  Final   Special Requests  NONE  Final   Culture   Final    NO SUSPICIOUS COLONIES, CONTINUING TO HOLD Performed at [facility]    Report Status PENDING  Incomplete     Studies:              All Imaging reviewed and is as per above notation   Scheduled Meds: . amoxicillin-clavulanate  1 tablet Oral BID  . cholecalciferol  2,000 Units Oral q morning - 10a  . darifenacin  7.5 mg Oral Daily  . heparin  5,000 Units Subcutaneous 3 times per day  . latanoprost  1 drop Left Eye QHS  . levothyroxine  25 mcg Oral QAC breakfast  . losartan  25 mg Oral q morning - 10a  . topiramate  25 mg Oral QPM   Continuous Infusions: . sodium chloride 10 mL/hr at 07/09/14 1614     Assessment/Plan: 1. Acute undifferentiated colitis-Cdiff PCR neg.  Follow-up the Stool cult ordered on admission.  As taking PO well, NSL.  Abd pain seems reasonable controlled.    Cipro/flagyll to Unasyn Monotherapy on 1/26--if she is afebrile in a.m. And tolerating a diet she can probably be discharged home with oral Augmentin 2. Chronic urinary tract infections on suppressive Macrodantin-will need discussion with her [facility] urologist regarding when to restart Macrodantin--potentially after course of Augmentin is complete.  Her urine culture from admission shows >100,000 CFU Escherichia coli-monitor the sensitivities prior to discharge as she has just completed a course of ciprofloxacin in December. 3. Pancreatic pseudocyst-OP GI f/u-Bili 1.5-->0.4 4. AKI, Ck-Glt Equation CKD stg 3-4 on admit.  Improved with IV saline-2/2 to Diarrhea as well as use of ARB losartan at the dose of 50 mg.  NSL on 07/09/14--this is steadily improving 5. Hypothryoid-cont levothyroxine 25 q am 6. MOd dementia at least-[place] OP Aricept/Namenda per PCP at next [facility] 7. Caution c Darifenacin as can cause confusion 8. Expected osteoporosis-Continue Cholecalciferol  2000 q am 9. Htn-continue Losartan lower dose 25 mg dailygiven renal insufficiency. Outpatient follow-up and be met required   Code Status: full Family Communication:  Discussed daughter bedsdie Disposition Plan: possible discharge in a.m. 07/11/14 if afebrile and no further concerns   Verneita Griffes, MD  [facility] Pager [phone] 07/10/2014, 4:59 PM    LOS: 2 days

## 2014-07-10 NOTE — Progress Notes (Signed)
[facility]  Patient Status: Active (receiving services up to time of hospitalization)  [facility] is providing the following services: RN, MSW and HHA  If patient discharges after hours, please call [phone].   Yvette Andrews 07/10/2014, 2:47 PM

## 2014-07-10 NOTE — Care Management Note (Signed)
CARE MANAGEMENT NOTE 07/10/2014  Patient:  Yvette Andrews,Yvette Andrews   Account Number:  [id number]  Date Initiated:  07/10/2014  Documentation initiated by:  Marney Doctor  Subjective/Objective Assessment:   81 yo admitted with acute colitis     Action/Plan:   From home with daughter and has [facility] services   Anticipated DC Date:  07/12/2014   Anticipated DC Plan:  [place]  CM consult      Choice offered to / List presented to:          [facility] arranged  HH-1 RN  [place].   Status of service:  In process, will continue to follow Medicare Important Message given?   (If response is "NO", the following Medicare IM given date fields will be blank) Date Medicare IM given:   Medicare IM given by:   Date Additional Medicare IM given:   Additional Medicare IM given by:    Discharge Disposition:    Per UR Regulation:  Reviewed for med. necessity/level of care/duration of stay  If discussed at [place] of Stay Meetings, dates discussed:    Comments:  07/10/14 Marney Doctor RN,BSN,NCM [phone] Spoke with pt and daughter at bedside about DC plan.  Pt is active with [facility] and would like to continue using their services at discharge.  [facility] rep following.  Will need MD orders to resume HH.  CM to continue to follow for DC needs.

## 2014-07-10 NOTE — Evaluation (Signed)
Occupational Therapy Evaluation Patient Details Name: Yvette Andrews MRN: [id number] DOB: November 21, 1920 Today's Date: 07/10/2014    History of Present Illness Pt admitted with colitis.   Clinical Impression   This 81 year old female was admitted for colitis.  Daughter states that she lowered pt to floor prior to admission.  Pt has assistance from daughter for ADLs but ambulated to bathroom and stood at sink to brush teeth.  Will follow in acute with these goals, at min guard to min A level.    Follow Up Recommendations  Home health OT ([place], as long as daughter can manage pt--if not, SNF)    Equipment Recommendations       Recommendations for Other Services       Precautions / Restrictions Precautions Precautions: Fall Restrictions Weight Bearing Restrictions: No      Mobility Bed Mobility     Rolling: Min assist         General bed mobility comments: used bedrails.  Min A to turn completely  Transfers                 General transfer comment: not tested due to fatique    Balance                                            ADL Overall ADL's : Needs assistance/impaired                                       General ADL Comments: Pt seen at bed level:  she was incontinent of urine and tired:  assisted with cleaning up:  total A.  Pt did roll with min A, using bedrails.  Daughter has a bedrail that fits between mattresses.  Pt too fatiqued to get up at this time.       Vision                     Perception     Praxis      Pertinent Vitals/Pain Pain Assessment: No/denies pain     Hand Dominance     Extremity/Trunk Assessment Upper Extremity Assessment Upper Extremity Assessment: Generalized weakness (R grossly 4-/5; L grossly 3+/5)           Communication Communication Communication: No difficulties   Cognition Arousal/Alertness:  (sleepy) Behavior During Therapy: WFL for tasks  assessed/performed Overall Cognitive Status: History of cognitive impairments - at baseline                     General Comments       Exercises       Shoulder Instructions      Home Living Family/patient expects to be discharged to:: Private residence Living Arrangements: Children Available Help at Discharge: Family;Available 24 hours/day               Bathroom Shower/Tub: Occupational psychologist: Standard     Home Equipment: Environmental consultant - 2 wheels;Wheelchair - [employer];Shower seat          Prior Functioning/Environment Level of Independence: Needs assistance        Comments: daughter assists with adls.  Pt ambulated to bathroom and depends garments changed:  incontinent    OT Diagnosis: Generalized weakness  OT Problem List: Decreased strength;Decreased activity tolerance;Decreased cognition   OT Treatment/Interventions: Self-care/ADL training;Therapeutic activities;Patient/family education    OT Goals(Current goals can be found in the care plan section) Acute Rehab OT Goals Patient Stated Goal: Daughter is hoping pt gets stronger so they can take a trip to [place] at the end of March. OT Goal Formulation: With family Time For Goal Achievement: 07/24/14 Potential to Achieve Goals: Good ADL Goals Pt Will Transfer to Toilet: with min assist;ambulating;bedside commode Additional ADL Goal #1: pt will stand for 3 minutes with min guard for ADLs Additional ADL Goal #2: pt will perform bed mobility with min A in preparation for ADLs  OT Frequency: Min 2X/week   Barriers to D/C:            Co-evaluation              End of Session    Activity Tolerance: Patient limited by fatigue Patient left: in bed;with call bell/phone within reach   Time: 1222-1242 OT Time Calculation (min): 20 min Charges:  OT General Charges $OT Visit: 1 Procedure OT Evaluation $Initial OT Evaluation Tier I: 1 Procedure OT Treatments $Self  Care/Home Management : 8-22 mins G-Codes:    Iman Orourke July 31, 2014, 1:20 PM   Lesle Chris, OTR/L [phone] 07-31-2014

## 2014-07-11 DIAGNOSIS — N179 Acute kidney failure, unspecified: Secondary | ICD-10-CM

## 2014-07-11 DIAGNOSIS — N3020 Other chronic cystitis without hematuria: Secondary | ICD-10-CM

## 2014-07-11 DIAGNOSIS — K863 Pseudocyst of pancreas: Secondary | ICD-10-CM

## 2014-07-11 LAB — BASIC METABOLIC PANEL WITH GFR
Anion gap: 4 — ABNORMAL LOW (ref 5–15)
BUN: 11 mg/dL (ref 6–23)
CO2: 24 mmol/L (ref 19–32)
Calcium: 8.3 mg/dL — ABNORMAL LOW (ref 8.4–10.5)
Chloride: 113 mmol/L — ABNORMAL HIGH (ref 96–112)
Creatinine, Ser: 0.92 mg/dL (ref 0.50–1.10)
GFR calc Af Amer: 61 mL/min — ABNORMAL LOW
GFR calc non Af Amer: 53 mL/min — ABNORMAL LOW
Glucose, Bld: 93 mg/dL (ref 70–99)
Potassium: 3.5 mmol/L (ref 3.5–5.1)
Sodium: 141 mmol/L (ref 135–145)

## 2014-07-11 LAB — URINE CULTURE: Colony Count: >=100000

## 2014-07-11 LAB — STOOL CULTURE

## 2014-07-11 MED ORDER — CEPHALEXIN 500 MG PO CAPS: 500.0000 mg | ORAL_CAPSULE | Freq: Two times a day (BID) | ORAL | Status: DC

## 2014-07-11 MED ORDER — SACCHAROMYCES BOULARDII 250 MG PO CAPS: 250.0000 mg | ORAL_CAPSULE | Freq: Two times a day (BID) | ORAL | Status: DC

## 2014-07-11 NOTE — Discharge Summary (Addendum)
Physician Discharge Summary  Kirk Sampley TAV:[id number] DOB: Jan 17, 1921 DOA: 07/08/2014  PCP: Henrine Screws, MD  Admit date: 07/08/2014 Discharge date: 07/11/2014  Time spent: *50 minutes  Recommendations for Outpatient Follow-up:  1. Follow up PCP in 2 weeks 2. Hold Macrodantin for next 10 days till the Keflex course is completed. Call urology to discuss the antibiotics.  Discharge Diagnoses:  Principal Problem:   Colitis, acute-unclear etiology Active Problems:   Pancreatic pseudocyst   AKI (acute kidney injury)   Impaired glucose metabolism   Chronic cystitis followed by Dr. Tresa Endo [facility] since 04/2011   History of hip fracture, 2009, Dr. Gladstone Lighter   Discharge Condition: Stable  Diet recommendation: Low salt diet  Filed Weights   07/08/14 0423 07/08/14 1130  Weight: 56.246 kg (124 lb) 58.196 kg (128 lb 4.8 oz)    History of present illness:  81 y/o ? known htn,chronic cystitis followed [facility] since 2012 [on suppressive Macrodantin 50 mg every afternoon]-Rx E.coli Pyelonephritis there and Rx 05/21/14 c 7 day course Ciprofloxacin, prior hip # 2009, Gerd c reported esoph dilatation, reported mild to moderate dementia per daughter awoke this morning and stated that she needed to go to the restroom. Family member daughter gives most of the history as patient has been given 50 mg of fentanyl and is somewhat sleepy. Ancillary information is that she had seen Dr. Gladstone Lighter 07/05/14 secondary to lower abdominal pain which was thought to be radiating from the right hip and an ultrasound had been ordered which has not yet performed because of the snow and poor climate conditions. She was not having any symptoms at that time Patient has chronic pyelo and is followed by urology at [facility], on suppressive therapy with Macrodantin.    Hospital Course:  1. Acute undifferentiated colitis-diarrhea has now resolved ,Cdiff PCR neg. Follow-up the Stool cult ordered on admission.  As taking PO well, NSL. Abd pain seems reasonable controlled.  Cipro/flagyll changed to Unasyn Monotherapy on 1/26-. Colitis is now resolved. Patient will be discharged on Florastor  250 mg by mouth twice a day, to prevent C. difficile colitis as patient takes long-term suppressive antibody therapy with Macrodantin. 2. Recurrent urinary tract infections on suppressive Macrodantin-will need discussion with her [facility] urologist regarding when to restart Macrodantin--potentially after course of Cefazolion is complete. Her urine culture from admission shows >100,000 CFU Escherichia coli- sensitive to Keflex. Will discharge on Keflex 500 by mouth twice a day for 10 days. 3. Pancreatic pseudocyst-OP GI f/u-Bili 1.5-->0.4. Patient will need repeat CT scan in one year. 4. AKI, Ck-Glt Equation CKD stg 3-4 on admit. Improved with IV saline-2/2 to Diarrhea as well as use of ARB losartan at the dose of 50 mg. at this time her creatinine is back to normal. Will restart losartan. 5. Hypothryoid-cont levothyroxine 25 q am 6. Moderate dementia -[place] OP Aricept/Namenda per PCP at next [facility] 7. Expected osteoporosis-Continue Cholecalciferol 2000 q am 8. Hypertension- blood pressure not elevated. We'll restart losartan 50 mg by mouth daily  Procedures: None Consultations:  None  Discharge Exam: Filed Vitals:   07/11/14 1446  BP: 126/78  Pulse: 64  Temp: 98.3 F (36.8 C)  Resp: 15    General: Appear in no acute distress Cardiovascular: S1s2 RRR Respiratory: Clear bilaterally  Discharge Instructions   Discharge Instructions    Diet - low sodium heart healthy    Complete by:  As directed      Increase activity slowly    Complete by:  As directed           Current Discharge Medication List    START taking these medications   Details  cephALEXin (KEFLEX) 500 MG capsule Take 1 capsule (500 mg total) by mouth 2 (two) times daily. Qty: 20 capsule, Refills: 0    saccharomyces  boulardii (FLORASTOR) 250 MG capsule Take 1 capsule (250 mg total) by mouth 2 (two) times daily. Qty: 60 capsule, Refills: 1      CONTINUE these medications which have NOT CHANGED   Details  beta carotene w/minerals (OCUVITE) tablet Take 1 tablet by mouth daily.    Cholecalciferol (VITAMIN D3) 2000 UNITS capsule Take 2,000 Units by mouth every morning.     levothyroxine (SYNTHROID, LEVOTHROID) 25 MCG tablet Take 25 mcg by mouth daily before breakfast.    losartan (COZAAR) 50 MG tablet Take 50 mg by mouth every morning.     LUMIGAN 0.01 % SOLN Place 1 drop into the left eye at bedtime. Refills: 4    nitrofurantoin (MACRODANTIN) 50 MG capsule Take 50 mg by mouth daily. Refills: 3    oxyCODONE-acetaminophen (PERCOCET/ROXICET) 5-325 MG per tablet Take 1 tablet by mouth 3 (three) times daily as needed. For pain. Refills: 0    pantoprazole (PROTONIX) 40 MG tablet Take 40 mg by mouth daily.     solifenacin (VESICARE) 10 MG tablet Take 10 mg by mouth daily.    topiramate (TOPAMAX) 25 MG tablet Take 25 mg by mouth every evening.       Allergies  Allergen Reactions  . Codeine Other (See Comments)    Upset stomach      The results of significant diagnostics from this hospitalization (including imaging, microbiology, ancillary and laboratory) are listed below for reference.    Significant Diagnostic Studies: Ct Abdomen Pelvis W Contrast  07/08/2014   CLINICAL DATA:  Low abdominal pain with nausea for 5 days.  EXAM: CT ABDOMEN AND PELVIS WITH CONTRAST  TECHNIQUE: Multidetector CT imaging of the abdomen and pelvis was performed using the standard protocol following bolus administration of intravenous contrast.  CONTRAST:  35mL OMNIPAQUE IOHEXOL 300 MG/ML SOLN, 83mL OMNIPAQUE IOHEXOL 300 MG/ML SOLN  COMPARISON:  None.  FINDINGS: BODY WALL: Unremarkable.  LOWER CHEST: Subpleural opacities in the right lower lobe are elongated and most consistent with atelectasis or scar. There is a trace  right pleural effusion.  ABDOMEN/PELVIS:  Liver: No focal abnormality.  Biliary: Cholecystectomy.  No calcified choledocholithiasis.  Pancreas: The pancreatic head is mildly enlarged and edematous and there is retroperitoneal edema throughout the upper abdominal ligaments and mesenteries. Ovoid simple appearing cyst present in the pancreatic body measuring 22 mm. This is near the main duct, which is not dilated.  Spleen: Unremarkable.  Adrenals: Unremarkable.  Kidneys and ureters: Bilateral renal cortical thinning and multiple cortical cysts. No hydronephrosis or urolithiasis.  Bladder: Thick walled appearance with prominent mucosal hyper enhancement. The patient has of current urinalysis.  Reproductive: Normal atrophic appearance of the uterus. The right ovary contains a 16 mm cyst which is considered incidental.  Bowel: There is circumferential thickening and mucosal hyper enhancement of the distal colon. Given the rectum is involved, ischemic colitis is less likely than infectious colitis. No bowel obstruction. No pericecal inflammation. There is extensive distal colonic diverticulosis without focal inflammation.  Retroperitoneum: Diffuse retroperitoneal edema which could be a combination of volume overload and reactive edema  Peritoneum: No ascites or pneumoperitoneum.  Vascular: Diffuse atherosclerosis of the aorta and branch vessels. There is no  major vessel occlusion to explain the colonic.  OSSEOUS: Simple lipoma again noted along the right iliopsoas. Remote intertrochanteric right femur fracture status post repair.  IMPRESSION: 1. Distal colitis, pattern favoring infectious over ischemic cause. 2. Cystitis, correlate with urinalysis. 3. Possible pancreatitis.  Please check serum pancreas enzymes. 4. 22 mm pancreatic cyst, favor pseudocyst. A 1 year followup CT could document stability.   Electronically Signed   By: Jorje Guild M.D.   On: 07/08/2014 06:17    Microbiology: Recent Results (from the  past 240 hour(s))  Urine culture     Status: None   Collection Time: 07/08/14  5:07 AM  Result Value Ref Range Status   Specimen Description URINE, CATHETERIZED  Final   Special Requests NONE  Final   Colony Count   Final    >=100,000 COLONIES/ML Performed at [facility]    Culture   Final    ESCHERICHIA COLI Performed at [facility]    Report Status 07/11/2014 FINAL  Final   Organism ID, Bacteria ESCHERICHIA COLI  Final      Susceptibility   Escherichia coli - MIC*    AMPICILLIN 8 SENSITIVE Sensitive     CEFAZOLIN <=4 SENSITIVE Sensitive     CEFTRIAXONE <=1 SENSITIVE Sensitive     CIPROFLOXACIN >=4 RESISTANT Resistant     GENTAMICIN <=1 SENSITIVE Sensitive     LEVOFLOXACIN >=8 RESISTANT Resistant     NITROFURANTOIN 64 INTERMEDIATE Intermediate     TOBRAMYCIN <=1 SENSITIVE Sensitive     TRIMETH/SULFA <=20 SENSITIVE Sensitive     PIP/TAZO <=4 SENSITIVE Sensitive     * ESCHERICHIA COLI  Clostridium Difficile by PCR     Status: None   Collection Time: 07/08/14  8:11 AM  Result Value Ref Range Status   C difficile by pcr NEGATIVE NEGATIVE Final    Comment: Performed at [facility]  Stool culture     Status: None   Collection Time: 07/08/14  8:25 AM  Result Value Ref Range Status   Specimen Description STOOL  Final   Special Requests NONE  Final   Culture   Final    NO SALMONELLA, SHIGELLA, CAMPYLOBACTER, YERSINIA, OR E.COLI 0157:H7 ISOLATED Performed at [facility]    Report Status 07/11/2014 FINAL  Final     Labs: Basic Metabolic Panel:  Recent Labs Lab 07/08/14 0453 07/09/14 0437 07/11/14 0413  NA 140 140 141  K 4.7 4.2 3.5  CL 106 109 113*  CO2 25 24 24   GLUCOSE 151* 100* 93  BUN 28* 19 11  CREATININE 1.22* 1.14* 0.92  CALCIUM 9.4 8.2* 8.3*   Liver Function Tests:  Recent Labs Lab 07/08/14 0453 07/09/14 0437  AST 36 17  ALT 10 8  ALKPHOS 50 43  BILITOT 1.5* 0.4  PROT 6.7 5.1*  ALBUMIN 2.9* 2.4*    Recent  Labs Lab 07/08/14 0453  LIPASE 41   No results for input(s): AMMONIA in the last 168 hours. CBC:  Recent Labs Lab 07/08/14 0453 07/09/14 0437  WBC 14.9* 9.6  NEUTROABS 12.1*  --   HGB 13.8 10.4*  HCT 42.6 32.1*  MCV 101.2* 100.9*  PLT 256 195   Cardiac Enzymes: No results for input(s): CKTOTAL, CKMB, CKMBINDEX, TROPONINI in the last 168 hours. BNP: BNP (last 3 results) No results for input(s): PROBNP in the last 8760 hours. CBG: No results for input(s): GLUCAP in the last 168 hours.     SignedEleonore Chiquito S  [facility] 07/11/2014, 2:54 PM

## 2014-07-11 NOTE — Progress Notes (Signed)
Nursing Discharge Summary  Patient ID: Alicea Wente MRN: [id number] DOB/AGE: March 29, 1921 81 y.o.  Admit date: 07/08/2014 Discharge date: 07/11/2014  Discharged Condition: good  Disposition: 01-Home or Self Care    Prescriptions Given: Medications called into pharmacy by Dr Darrick Meigs. Medications and follow up appointments discussed with daughter at bedside. Verbalized understanding without further questions.  Means of Discharge: Patient to be transported via wheelchair to be discharged home.   Signed: Buel Ream 07/11/2014, 4:39 PM

## 2014-07-11 NOTE — Progress Notes (Signed)
Physical Therapy Treatment Patient Details Name: Yvette Andrews MRN: [id number] DOB: 06/20/1920 Today's Date: 07/11/2014    History of Present Illness Pt admitted with colitis.    PT Comments    Pt in bed incont of urine.  Assisted OOB to [facility].  Assisted with wash up and gown change.  Assisted with amb in hallway.  Pt requires Min Assist for all mobility.  Daughter feels very comfortable taking pt home at this level and providing 24/7 care at home as she has done prior.  Daughter would like HH PT to get mom stronger.  Prior she would take her to the stores and out to eat.   Follow Up Recommendations  Home health PT     Equipment Recommendations  None recommended by PT    Recommendations for Other Services       Precautions / Restrictions Precautions Precautions: Fall Restrictions Weight Bearing Restrictions: No    Mobility  Bed Mobility Overal bed mobility: Needs Assistance Bed Mobility: Supine to Sit     Supine to sit: Min assist     General bed mobility comments: Min Assist and increased time to scoot to EOB.  Transfers Overall transfer level: Needs assistance Equipment used: Rolling walker (2 wheeled) Transfers: Sit to/from [facility] Sit to Stand: Min assist Stand pivot transfers: Min assist       General transfer comment: 50% VC's on proper tech and hand placement to increase self assist.  Repeat cueing to increase motivation.   Ambulation/Gait Ambulation/Gait assistance: Min assist;Min guard Ambulation Distance (Feet): 85 Feet Assistive device: Rolling walker (2 wheeled) Gait Pattern/deviations: Step-to pattern;Step-through pattern;Trunk flexed Gait velocity: decreased   General Gait Details: daughter assisted with amb using encouragement.  Pt required assist with advancing RW as she is use to her 4WW cruiser walker.  Pt fearful of falling.  Recliner following for safety.  Fair balance.  Fair cognition.  Daughter states, "mom knows not to get  up by herself".    Stairs Stairs:  (can use ramp enterance)          Wheelchair Mobility    Modified Rankin (Stroke Patients Only)       Balance                                    Cognition Arousal/Alertness: Awake/alert Behavior During Therapy: WFL for tasks assessed/performed Overall Cognitive Status: History of cognitive impairments - at baseline                      Exercises      General Comments        Pertinent Vitals/Pain Pain Assessment: No/denies pain    Home Living                      Prior Function            PT Goals (current goals can now be found in the care plan section) Progress towards PT goals: Progressing toward goals    Frequency  Min 3X/week    PT Plan      Co-evaluation             End of Session Equipment Utilized During Treatment: Gait belt Activity Tolerance: Patient tolerated treatment well Patient left: in chair;with family/visitor present;with call bell/phone within reach;with chair alarm set     Time: 6237-6283 PT Time Calculation (min) (ACUTE ONLY):  40 min  Charges:  $Gait Training: 8-22 mins $Therapeutic Activity: 23-37 mins                    G Codes:      Rica Koyanagi  PTA [facility]  Acute  Rehab Pager      [phone]

## 2014-07-11 NOTE — Progress Notes (Signed)
OT Cancellation Note  Patient Details Name: Yvette Andrews MRN: [id number] DOB: February 21, 1921   Cancelled Treatment:    Reason Eval/Treat Not Completed: Other (comment)  Pt is working with PT  Will return later if schedule permits.    Tailyn Hantz 07/11/2014, 10:52 AM  Lesle Chris, OTR/L [phone] 07/11/2014

## 2014-07-12 DIAGNOSIS — M6281 Muscle weakness (generalized): Secondary | ICD-10-CM | POA: Diagnosis not present

## 2014-07-12 DIAGNOSIS — I10 Essential (primary) hypertension: Secondary | ICD-10-CM | POA: Diagnosis not present

## 2014-07-12 DIAGNOSIS — N390 Urinary tract infection, site not specified: Secondary | ICD-10-CM | POA: Diagnosis not present

## 2014-07-16 DIAGNOSIS — N390 Urinary tract infection, site not specified: Secondary | ICD-10-CM | POA: Diagnosis not present

## 2014-07-16 DIAGNOSIS — I10 Essential (primary) hypertension: Secondary | ICD-10-CM | POA: Diagnosis not present

## 2014-07-16 DIAGNOSIS — M6281 Muscle weakness (generalized): Secondary | ICD-10-CM | POA: Diagnosis not present

## 2014-07-18 ENCOUNTER — Other Ambulatory Visit: Payer: Self-pay | Admitting: Orthopedic Surgery

## 2014-07-18 DIAGNOSIS — R1084 Generalized abdominal pain: Secondary | ICD-10-CM

## 2014-07-18 DIAGNOSIS — M6281 Muscle weakness (generalized): Secondary | ICD-10-CM | POA: Diagnosis not present

## 2014-07-18 DIAGNOSIS — N390 Urinary tract infection, site not specified: Secondary | ICD-10-CM | POA: Diagnosis not present

## 2014-07-18 DIAGNOSIS — I10 Essential (primary) hypertension: Secondary | ICD-10-CM | POA: Diagnosis not present

## 2014-07-19 ENCOUNTER — Other Ambulatory Visit: Payer: Self-pay | Admitting: Orthopedic Surgery

## 2014-07-19 DIAGNOSIS — K529 Noninfective gastroenteritis and colitis, unspecified: Secondary | ICD-10-CM | POA: Diagnosis not present

## 2014-07-19 DIAGNOSIS — N3010 Interstitial cystitis (chronic) without hematuria: Secondary | ICD-10-CM | POA: Diagnosis not present

## 2014-07-19 DIAGNOSIS — I10 Essential (primary) hypertension: Secondary | ICD-10-CM | POA: Diagnosis not present

## 2014-07-19 DIAGNOSIS — R1084 Generalized abdominal pain: Secondary | ICD-10-CM

## 2014-07-19 DIAGNOSIS — R7309 Other abnormal glucose: Secondary | ICD-10-CM | POA: Diagnosis not present

## 2014-07-19 DIAGNOSIS — M6281 Muscle weakness (generalized): Secondary | ICD-10-CM | POA: Diagnosis not present

## 2014-07-19 DIAGNOSIS — K863 Pseudocyst of pancreas: Secondary | ICD-10-CM | POA: Diagnosis not present

## 2014-07-20 DIAGNOSIS — N3010 Interstitial cystitis (chronic) without hematuria: Secondary | ICD-10-CM | POA: Diagnosis not present

## 2014-07-20 DIAGNOSIS — K529 Noninfective gastroenteritis and colitis, unspecified: Secondary | ICD-10-CM | POA: Diagnosis not present

## 2014-07-20 DIAGNOSIS — M6281 Muscle weakness (generalized): Secondary | ICD-10-CM | POA: Diagnosis not present

## 2014-07-20 DIAGNOSIS — R7309 Other abnormal glucose: Secondary | ICD-10-CM | POA: Diagnosis not present

## 2014-07-20 DIAGNOSIS — I10 Essential (primary) hypertension: Secondary | ICD-10-CM | POA: Diagnosis not present

## 2014-07-20 DIAGNOSIS — K863 Pseudocyst of pancreas: Secondary | ICD-10-CM | POA: Diagnosis not present

## 2014-07-23 DIAGNOSIS — M6281 Muscle weakness (generalized): Secondary | ICD-10-CM | POA: Diagnosis not present

## 2014-07-23 DIAGNOSIS — I10 Essential (primary) hypertension: Secondary | ICD-10-CM | POA: Diagnosis not present

## 2014-07-23 DIAGNOSIS — K863 Pseudocyst of pancreas: Secondary | ICD-10-CM | POA: Diagnosis not present

## 2014-07-23 DIAGNOSIS — Z0000 Encounter for general adult medical examination without abnormal findings: Secondary | ICD-10-CM | POA: Diagnosis not present

## 2014-07-23 DIAGNOSIS — N3020 Other chronic cystitis without hematuria: Secondary | ICD-10-CM | POA: Diagnosis not present

## 2014-07-23 DIAGNOSIS — N3010 Interstitial cystitis (chronic) without hematuria: Secondary | ICD-10-CM | POA: Diagnosis not present

## 2014-07-23 DIAGNOSIS — R7309 Other abnormal glucose: Secondary | ICD-10-CM | POA: Diagnosis not present

## 2014-07-23 DIAGNOSIS — K529 Noninfective gastroenteritis and colitis, unspecified: Secondary | ICD-10-CM | POA: Diagnosis not present

## 2014-07-25 DIAGNOSIS — N3010 Interstitial cystitis (chronic) without hematuria: Secondary | ICD-10-CM | POA: Diagnosis not present

## 2014-07-25 DIAGNOSIS — K529 Noninfective gastroenteritis and colitis, unspecified: Secondary | ICD-10-CM | POA: Diagnosis not present

## 2014-07-25 DIAGNOSIS — I10 Essential (primary) hypertension: Secondary | ICD-10-CM | POA: Diagnosis not present

## 2014-07-25 DIAGNOSIS — M6281 Muscle weakness (generalized): Secondary | ICD-10-CM | POA: Diagnosis not present

## 2014-07-25 DIAGNOSIS — K863 Pseudocyst of pancreas: Secondary | ICD-10-CM | POA: Diagnosis not present

## 2014-07-25 DIAGNOSIS — R7309 Other abnormal glucose: Secondary | ICD-10-CM | POA: Diagnosis not present

## 2014-07-26 DIAGNOSIS — K529 Noninfective gastroenteritis and colitis, unspecified: Secondary | ICD-10-CM | POA: Diagnosis not present

## 2014-07-26 DIAGNOSIS — I10 Essential (primary) hypertension: Secondary | ICD-10-CM | POA: Diagnosis not present

## 2014-07-26 DIAGNOSIS — N3010 Interstitial cystitis (chronic) without hematuria: Secondary | ICD-10-CM | POA: Diagnosis not present

## 2014-07-26 DIAGNOSIS — R7309 Other abnormal glucose: Secondary | ICD-10-CM | POA: Diagnosis not present

## 2014-07-26 DIAGNOSIS — M6281 Muscle weakness (generalized): Secondary | ICD-10-CM | POA: Diagnosis not present

## 2014-07-26 DIAGNOSIS — K863 Pseudocyst of pancreas: Secondary | ICD-10-CM | POA: Diagnosis not present

## 2014-07-27 DIAGNOSIS — M6281 Muscle weakness (generalized): Secondary | ICD-10-CM | POA: Diagnosis not present

## 2014-07-27 DIAGNOSIS — I10 Essential (primary) hypertension: Secondary | ICD-10-CM | POA: Diagnosis not present

## 2014-07-27 DIAGNOSIS — R7309 Other abnormal glucose: Secondary | ICD-10-CM | POA: Diagnosis not present

## 2014-07-27 DIAGNOSIS — K863 Pseudocyst of pancreas: Secondary | ICD-10-CM | POA: Diagnosis not present

## 2014-07-27 DIAGNOSIS — N3010 Interstitial cystitis (chronic) without hematuria: Secondary | ICD-10-CM | POA: Diagnosis not present

## 2014-07-27 DIAGNOSIS — K529 Noninfective gastroenteritis and colitis, unspecified: Secondary | ICD-10-CM | POA: Diagnosis not present

## 2014-08-01 DIAGNOSIS — R7309 Other abnormal glucose: Secondary | ICD-10-CM | POA: Diagnosis not present

## 2014-08-01 DIAGNOSIS — N3010 Interstitial cystitis (chronic) without hematuria: Secondary | ICD-10-CM | POA: Diagnosis not present

## 2014-08-01 DIAGNOSIS — I10 Essential (primary) hypertension: Secondary | ICD-10-CM | POA: Diagnosis not present

## 2014-08-01 DIAGNOSIS — M6281 Muscle weakness (generalized): Secondary | ICD-10-CM | POA: Diagnosis not present

## 2014-08-01 DIAGNOSIS — K529 Noninfective gastroenteritis and colitis, unspecified: Secondary | ICD-10-CM | POA: Diagnosis not present

## 2014-08-01 DIAGNOSIS — K863 Pseudocyst of pancreas: Secondary | ICD-10-CM | POA: Diagnosis not present

## 2014-08-02 DIAGNOSIS — R7309 Other abnormal glucose: Secondary | ICD-10-CM | POA: Diagnosis not present

## 2014-08-02 DIAGNOSIS — N3010 Interstitial cystitis (chronic) without hematuria: Secondary | ICD-10-CM | POA: Diagnosis not present

## 2014-08-02 DIAGNOSIS — K863 Pseudocyst of pancreas: Secondary | ICD-10-CM | POA: Diagnosis not present

## 2014-08-02 DIAGNOSIS — I10 Essential (primary) hypertension: Secondary | ICD-10-CM | POA: Diagnosis not present

## 2014-08-02 DIAGNOSIS — M6281 Muscle weakness (generalized): Secondary | ICD-10-CM | POA: Diagnosis not present

## 2014-08-02 DIAGNOSIS — K529 Noninfective gastroenteritis and colitis, unspecified: Secondary | ICD-10-CM | POA: Diagnosis not present

## 2014-08-08 DIAGNOSIS — M6281 Muscle weakness (generalized): Secondary | ICD-10-CM | POA: Diagnosis not present

## 2014-08-08 DIAGNOSIS — N3010 Interstitial cystitis (chronic) without hematuria: Secondary | ICD-10-CM | POA: Diagnosis not present

## 2014-08-08 DIAGNOSIS — R7309 Other abnormal glucose: Secondary | ICD-10-CM | POA: Diagnosis not present

## 2014-08-08 DIAGNOSIS — K529 Noninfective gastroenteritis and colitis, unspecified: Secondary | ICD-10-CM | POA: Diagnosis not present

## 2014-08-08 DIAGNOSIS — I10 Essential (primary) hypertension: Secondary | ICD-10-CM | POA: Diagnosis not present

## 2014-08-08 DIAGNOSIS — K863 Pseudocyst of pancreas: Secondary | ICD-10-CM | POA: Diagnosis not present

## 2014-08-10 DIAGNOSIS — M6281 Muscle weakness (generalized): Secondary | ICD-10-CM | POA: Diagnosis not present

## 2014-08-10 DIAGNOSIS — I10 Essential (primary) hypertension: Secondary | ICD-10-CM | POA: Diagnosis not present

## 2014-08-10 DIAGNOSIS — R7309 Other abnormal glucose: Secondary | ICD-10-CM | POA: Diagnosis not present

## 2014-08-10 DIAGNOSIS — K529 Noninfective gastroenteritis and colitis, unspecified: Secondary | ICD-10-CM | POA: Diagnosis not present

## 2014-08-10 DIAGNOSIS — N3010 Interstitial cystitis (chronic) without hematuria: Secondary | ICD-10-CM | POA: Diagnosis not present

## 2014-08-10 DIAGNOSIS — K863 Pseudocyst of pancreas: Secondary | ICD-10-CM | POA: Diagnosis not present

## 2014-08-11 DIAGNOSIS — K529 Noninfective gastroenteritis and colitis, unspecified: Secondary | ICD-10-CM | POA: Diagnosis not present

## 2014-08-11 DIAGNOSIS — A09 Infectious gastroenteritis and colitis, unspecified: Secondary | ICD-10-CM | POA: Diagnosis not present

## 2014-08-14 DIAGNOSIS — M6281 Muscle weakness (generalized): Secondary | ICD-10-CM | POA: Diagnosis not present

## 2014-08-14 DIAGNOSIS — R7309 Other abnormal glucose: Secondary | ICD-10-CM | POA: Diagnosis not present

## 2014-08-14 DIAGNOSIS — N3010 Interstitial cystitis (chronic) without hematuria: Secondary | ICD-10-CM | POA: Diagnosis not present

## 2014-08-14 DIAGNOSIS — I10 Essential (primary) hypertension: Secondary | ICD-10-CM | POA: Diagnosis not present

## 2014-08-14 DIAGNOSIS — K529 Noninfective gastroenteritis and colitis, unspecified: Secondary | ICD-10-CM | POA: Diagnosis not present

## 2014-08-14 DIAGNOSIS — K863 Pseudocyst of pancreas: Secondary | ICD-10-CM | POA: Diagnosis not present

## 2014-08-17 DIAGNOSIS — K529 Noninfective gastroenteritis and colitis, unspecified: Secondary | ICD-10-CM | POA: Diagnosis not present

## 2014-08-17 DIAGNOSIS — N3010 Interstitial cystitis (chronic) without hematuria: Secondary | ICD-10-CM | POA: Diagnosis not present

## 2014-08-17 DIAGNOSIS — I10 Essential (primary) hypertension: Secondary | ICD-10-CM | POA: Diagnosis not present

## 2014-08-17 DIAGNOSIS — R7309 Other abnormal glucose: Secondary | ICD-10-CM | POA: Diagnosis not present

## 2014-08-17 DIAGNOSIS — M6281 Muscle weakness (generalized): Secondary | ICD-10-CM | POA: Diagnosis not present

## 2014-08-17 DIAGNOSIS — K863 Pseudocyst of pancreas: Secondary | ICD-10-CM | POA: Diagnosis not present

## 2014-08-20 DIAGNOSIS — H4011X2 Primary open-angle glaucoma, moderate stage: Secondary | ICD-10-CM | POA: Diagnosis not present

## 2014-08-21 DIAGNOSIS — R7309 Other abnormal glucose: Secondary | ICD-10-CM | POA: Diagnosis not present

## 2014-08-21 DIAGNOSIS — N3010 Interstitial cystitis (chronic) without hematuria: Secondary | ICD-10-CM | POA: Diagnosis not present

## 2014-08-21 DIAGNOSIS — I10 Essential (primary) hypertension: Secondary | ICD-10-CM | POA: Diagnosis not present

## 2014-08-21 DIAGNOSIS — M6281 Muscle weakness (generalized): Secondary | ICD-10-CM | POA: Diagnosis not present

## 2014-08-21 DIAGNOSIS — K529 Noninfective gastroenteritis and colitis, unspecified: Secondary | ICD-10-CM | POA: Diagnosis not present

## 2014-08-21 DIAGNOSIS — K863 Pseudocyst of pancreas: Secondary | ICD-10-CM | POA: Diagnosis not present

## 2014-08-23 DIAGNOSIS — K529 Noninfective gastroenteritis and colitis, unspecified: Secondary | ICD-10-CM | POA: Diagnosis not present

## 2014-08-23 DIAGNOSIS — R7309 Other abnormal glucose: Secondary | ICD-10-CM | POA: Diagnosis not present

## 2014-08-23 DIAGNOSIS — I10 Essential (primary) hypertension: Secondary | ICD-10-CM | POA: Diagnosis not present

## 2014-08-23 DIAGNOSIS — M6281 Muscle weakness (generalized): Secondary | ICD-10-CM | POA: Diagnosis not present

## 2014-08-23 DIAGNOSIS — K863 Pseudocyst of pancreas: Secondary | ICD-10-CM | POA: Diagnosis not present

## 2014-08-23 DIAGNOSIS — N3010 Interstitial cystitis (chronic) without hematuria: Secondary | ICD-10-CM | POA: Diagnosis not present

## 2014-08-29 DIAGNOSIS — R7309 Other abnormal glucose: Secondary | ICD-10-CM | POA: Diagnosis not present

## 2014-08-29 DIAGNOSIS — N3010 Interstitial cystitis (chronic) without hematuria: Secondary | ICD-10-CM | POA: Diagnosis not present

## 2014-08-29 DIAGNOSIS — K863 Pseudocyst of pancreas: Secondary | ICD-10-CM | POA: Diagnosis not present

## 2014-08-29 DIAGNOSIS — K529 Noninfective gastroenteritis and colitis, unspecified: Secondary | ICD-10-CM | POA: Diagnosis not present

## 2014-08-29 DIAGNOSIS — I10 Essential (primary) hypertension: Secondary | ICD-10-CM | POA: Diagnosis not present

## 2014-08-29 DIAGNOSIS — M6281 Muscle weakness (generalized): Secondary | ICD-10-CM | POA: Diagnosis not present

## 2014-09-04 DIAGNOSIS — N3010 Interstitial cystitis (chronic) without hematuria: Secondary | ICD-10-CM | POA: Diagnosis not present

## 2014-09-04 DIAGNOSIS — K863 Pseudocyst of pancreas: Secondary | ICD-10-CM | POA: Diagnosis not present

## 2014-09-04 DIAGNOSIS — K529 Noninfective gastroenteritis and colitis, unspecified: Secondary | ICD-10-CM | POA: Diagnosis not present

## 2014-09-04 DIAGNOSIS — R7309 Other abnormal glucose: Secondary | ICD-10-CM | POA: Diagnosis not present

## 2014-09-04 DIAGNOSIS — M6281 Muscle weakness (generalized): Secondary | ICD-10-CM | POA: Diagnosis not present

## 2014-09-04 DIAGNOSIS — I10 Essential (primary) hypertension: Secondary | ICD-10-CM | POA: Diagnosis not present

## 2014-09-05 DIAGNOSIS — N3010 Interstitial cystitis (chronic) without hematuria: Secondary | ICD-10-CM | POA: Diagnosis not present

## 2014-09-05 DIAGNOSIS — K529 Noninfective gastroenteritis and colitis, unspecified: Secondary | ICD-10-CM | POA: Diagnosis not present

## 2014-09-05 DIAGNOSIS — I10 Essential (primary) hypertension: Secondary | ICD-10-CM | POA: Diagnosis not present

## 2014-09-05 DIAGNOSIS — K863 Pseudocyst of pancreas: Secondary | ICD-10-CM | POA: Diagnosis not present

## 2014-09-05 DIAGNOSIS — M6281 Muscle weakness (generalized): Secondary | ICD-10-CM | POA: Diagnosis not present

## 2014-09-05 DIAGNOSIS — R7309 Other abnormal glucose: Secondary | ICD-10-CM | POA: Diagnosis not present

## 2014-09-14 DIAGNOSIS — M6281 Muscle weakness (generalized): Secondary | ICD-10-CM | POA: Diagnosis not present

## 2014-09-14 DIAGNOSIS — K529 Noninfective gastroenteritis and colitis, unspecified: Secondary | ICD-10-CM | POA: Diagnosis not present

## 2014-09-14 DIAGNOSIS — K863 Pseudocyst of pancreas: Secondary | ICD-10-CM | POA: Diagnosis not present

## 2014-09-14 DIAGNOSIS — N3010 Interstitial cystitis (chronic) without hematuria: Secondary | ICD-10-CM | POA: Diagnosis not present

## 2014-09-14 DIAGNOSIS — R7309 Other abnormal glucose: Secondary | ICD-10-CM | POA: Diagnosis not present

## 2014-09-14 DIAGNOSIS — I10 Essential (primary) hypertension: Secondary | ICD-10-CM | POA: Diagnosis not present

## 2014-10-08 DIAGNOSIS — N3020 Other chronic cystitis without hematuria: Secondary | ICD-10-CM | POA: Diagnosis not present

## 2014-10-09 DIAGNOSIS — R51 Headache: Secondary | ICD-10-CM | POA: Diagnosis not present

## 2014-10-09 DIAGNOSIS — E559 Vitamin D deficiency, unspecified: Secondary | ICD-10-CM | POA: Diagnosis not present

## 2014-10-09 DIAGNOSIS — G2581 Restless legs syndrome: Secondary | ICD-10-CM | POA: Diagnosis not present

## 2014-10-09 DIAGNOSIS — M25569 Pain in unspecified knee: Secondary | ICD-10-CM | POA: Diagnosis not present

## 2014-10-09 DIAGNOSIS — N3010 Interstitial cystitis (chronic) without hematuria: Secondary | ICD-10-CM | POA: Diagnosis not present

## 2014-10-09 DIAGNOSIS — E039 Hypothyroidism, unspecified: Secondary | ICD-10-CM | POA: Diagnosis not present

## 2014-10-09 DIAGNOSIS — R350 Frequency of micturition: Secondary | ICD-10-CM | POA: Diagnosis not present

## 2014-10-09 DIAGNOSIS — I10 Essential (primary) hypertension: Secondary | ICD-10-CM | POA: Diagnosis not present

## 2014-10-09 DIAGNOSIS — K219 Gastro-esophageal reflux disease without esophagitis: Secondary | ICD-10-CM | POA: Diagnosis not present

## 2014-10-22 DIAGNOSIS — L570 Actinic keratosis: Secondary | ICD-10-CM | POA: Diagnosis not present

## 2014-10-22 DIAGNOSIS — L821 Other seborrheic keratosis: Secondary | ICD-10-CM | POA: Diagnosis not present

## 2014-11-05 DIAGNOSIS — N3020 Other chronic cystitis without hematuria: Secondary | ICD-10-CM | POA: Diagnosis not present

## 2014-12-24 DIAGNOSIS — H4011X2 Primary open-angle glaucoma, moderate stage: Secondary | ICD-10-CM | POA: Diagnosis not present

## 2015-01-22 DIAGNOSIS — M545 Low back pain: Secondary | ICD-10-CM | POA: Diagnosis not present

## 2015-01-22 DIAGNOSIS — M1711 Unilateral primary osteoarthritis, right knee: Secondary | ICD-10-CM | POA: Diagnosis not present

## 2015-02-06 DIAGNOSIS — K219 Gastro-esophageal reflux disease without esophagitis: Secondary | ICD-10-CM | POA: Diagnosis not present

## 2015-02-06 DIAGNOSIS — E559 Vitamin D deficiency, unspecified: Secondary | ICD-10-CM | POA: Diagnosis not present

## 2015-02-06 DIAGNOSIS — M25569 Pain in unspecified knee: Secondary | ICD-10-CM | POA: Diagnosis not present

## 2015-02-06 DIAGNOSIS — B351 Tinea unguium: Secondary | ICD-10-CM | POA: Diagnosis not present

## 2015-02-06 DIAGNOSIS — N3010 Interstitial cystitis (chronic) without hematuria: Secondary | ICD-10-CM | POA: Diagnosis not present

## 2015-02-06 DIAGNOSIS — R350 Frequency of micturition: Secondary | ICD-10-CM | POA: Diagnosis not present

## 2015-02-06 DIAGNOSIS — I10 Essential (primary) hypertension: Secondary | ICD-10-CM | POA: Diagnosis not present

## 2015-02-06 DIAGNOSIS — R51 Headache: Secondary | ICD-10-CM | POA: Diagnosis not present

## 2015-02-06 DIAGNOSIS — G2581 Restless legs syndrome: Secondary | ICD-10-CM | POA: Diagnosis not present

## 2015-02-06 DIAGNOSIS — E039 Hypothyroidism, unspecified: Secondary | ICD-10-CM | POA: Diagnosis not present

## 2015-02-11 ENCOUNTER — Encounter (HOSPITAL_COMMUNITY): Payer: Self-pay

## 2015-02-11 ENCOUNTER — Emergency Department (HOSPITAL_COMMUNITY): Payer: Medicare Other

## 2015-02-11 ENCOUNTER — Emergency Department (HOSPITAL_COMMUNITY)
Admission: EM | Admit: 2015-02-11 | Discharge: 2015-02-11 | Disposition: A | Discharge status: Home / Self Care | Payer: Medicare Other | Attending: Emergency Medicine | Admitting: Emergency Medicine

## 2015-02-11 DIAGNOSIS — E079 Disorder of thyroid, unspecified: Secondary | ICD-10-CM | POA: Diagnosis not present

## 2015-02-11 DIAGNOSIS — Z79899 Other long term (current) drug therapy: Secondary | ICD-10-CM | POA: Diagnosis not present

## 2015-02-11 DIAGNOSIS — Z7982 Long term (current) use of aspirin: Secondary | ICD-10-CM | POA: Diagnosis not present

## 2015-02-11 DIAGNOSIS — N281 Cyst of kidney, acquired: Secondary | ICD-10-CM | POA: Diagnosis not present

## 2015-02-11 DIAGNOSIS — I10 Essential (primary) hypertension: Secondary | ICD-10-CM | POA: Insufficient documentation

## 2015-02-11 DIAGNOSIS — R109 Unspecified abdominal pain: Secondary | ICD-10-CM

## 2015-02-11 DIAGNOSIS — K2970 Gastritis, unspecified, without bleeding: Secondary | ICD-10-CM | POA: Diagnosis not present

## 2015-02-11 DIAGNOSIS — N390 Urinary tract infection, site not specified: Secondary | ICD-10-CM | POA: Insufficient documentation

## 2015-02-11 DIAGNOSIS — R1032 Left lower quadrant pain: Secondary | ICD-10-CM

## 2015-02-11 DIAGNOSIS — N8320 Unspecified ovarian cysts: Secondary | ICD-10-CM | POA: Diagnosis not present

## 2015-02-11 DIAGNOSIS — K219 Gastro-esophageal reflux disease without esophagitis: Secondary | ICD-10-CM | POA: Diagnosis not present

## 2015-02-11 DIAGNOSIS — R10814 Left lower quadrant abdominal tenderness: Secondary | ICD-10-CM | POA: Diagnosis not present

## 2015-02-11 DIAGNOSIS — K868 Other specified diseases of pancreas: Secondary | ICD-10-CM | POA: Diagnosis not present

## 2015-02-11 LAB — CBC WITH DIFFERENTIAL/PLATELET
Basophils Absolute: 0 10*3/uL (ref 0.0–0.1)
Basophils Relative: 0 % (ref 0–1)
Eosinophils Absolute: 0.1 10*3/uL (ref 0.0–0.7)
Eosinophils Relative: 1 % (ref 0–5)
HEMATOCRIT: 41.1 % (ref 36.0–46.0)
HEMOGLOBIN: 13.4 g/dL (ref 12.0–15.0)
LYMPHS ABS: 1.5 10*3/uL (ref 0.7–4.0)
Lymphocytes Relative: 13 % (ref 12–46)
MCH: 33 pg (ref 26.0–34.0)
MCHC: 32.6 g/dL (ref 30.0–36.0)
MCV: 101.2 fL — ABNORMAL HIGH (ref 78.0–100.0)
MONO ABS: 0.8 10*3/uL (ref 0.1–1.0)
Monocytes Relative: 7 % (ref 3–12)
NEUTROS ABS: 9.1 10*3/uL — AB (ref 1.7–7.7)
NEUTROS PCT: 79 % — AB (ref 43–77)
Platelets: 174 10*3/uL (ref 150–400)
RBC: 4.06 MIL/uL (ref 3.87–5.11)
RDW: 13.4 % (ref 11.5–15.5)
WBC: 11.5 10*3/uL — ABNORMAL HIGH (ref 4.0–10.5)

## 2015-02-11 LAB — COMPREHENSIVE METABOLIC PANEL
ALBUMIN: 3.7 g/dL (ref 3.5–5.0)
ALK PHOS: 57 U/L (ref 38–126)
ALT: 14 U/L (ref 14–54)
ANION GAP: 9 (ref 5–15)
AST: 30 U/L (ref 15–41)
BUN: 30 mg/dL — ABNORMAL HIGH (ref 6–20)
CHLORIDE: 104 mmol/L (ref 101–111)
CO2: 27 mmol/L (ref 22–32)
Calcium: 9.1 mg/dL (ref 8.9–10.3)
Creatinine, Ser: 1.37 mg/dL — ABNORMAL HIGH (ref 0.44–1.00)
GFR calc non Af Amer: 32 mL/min — ABNORMAL LOW (ref >60)
GFR, EST AFRICAN AMERICAN: 38 mL/min — AB (ref >60)
Glucose, Bld: 116 mg/dL — ABNORMAL HIGH (ref 65–99)
POTASSIUM: 4 mmol/L (ref 3.5–5.1)
SODIUM: 140 mmol/L (ref 135–145)
Total Bilirubin: 0.5 mg/dL (ref 0.3–1.2)
Total Protein: 7.4 g/dL (ref 6.5–8.1)

## 2015-02-11 LAB — URINALYSIS, ROUTINE W REFLEX MICROSCOPIC
BILIRUBIN URINE: NEGATIVE
Glucose, UA: NEGATIVE mg/dL
Ketones, ur: NEGATIVE mg/dL
NITRITE: POSITIVE — AB
PROTEIN: 100 mg/dL — AB
SPECIFIC GRAVITY, URINE: 1.015 (ref 1.005–1.030)
UROBILINOGEN UA: 1 mg/dL (ref 0.0–1.0)
pH: 5 (ref 5.0–8.0)

## 2015-02-11 LAB — URINE MICROSCOPIC-ADD ON

## 2015-02-11 LAB — LIPASE, BLOOD: Lipase: 52 U/L — ABNORMAL HIGH (ref 22–51)

## 2015-02-11 LAB — I-STAT TROPONIN, ED: Troponin i, poc: 0 ng/mL (ref 0.00–0.08)

## 2015-02-11 LAB — I-STAT CG4 LACTIC ACID, ED: Lactic Acid, Venous: 1.31 mmol/L (ref 0.5–2.0)

## 2015-02-11 MED ORDER — CEPHALEXIN 500 MG PO CAPS: 500.0000 mg | ORAL_CAPSULE | Freq: Two times a day (BID) | ORAL | Status: DC

## 2015-02-11 MED ORDER — SODIUM CHLORIDE 0.9 % IV BOLUS (SEPSIS)
500.0000 mL | Freq: Once | INTRAVENOUS | Status: AC
  Administered 2015-02-11: 500 mL via INTRAVENOUS

## 2015-02-11 MED ORDER — IOHEXOL 300 MG/ML  SOLN: 50.0000 mL | Freq: Once | INTRAMUSCULAR | Status: DC | PRN

## 2015-02-11 MED ORDER — ONDANSETRON HCL 4 MG PO TABS: 4.0000 mg | ORAL_TABLET | Freq: Four times a day (QID) | ORAL | Status: DC

## 2015-02-11 MED ORDER — ONDANSETRON HCL 4 MG/2ML IJ SOLN
4.0000 mg | Freq: Once | INTRAMUSCULAR | Status: AC
  Administered 2015-02-11: 4 mg via INTRAVENOUS
  Filled 2015-02-11: qty 2

## 2015-02-11 MED ORDER — DEXTROSE 5 % IV SOLN
1.0000 g | Freq: Once | INTRAVENOUS | Status: AC
  Administered 2015-02-11: 1 g via INTRAVENOUS
  Filled 2015-02-11: qty 10

## 2015-02-11 NOTE — Discharge Instructions (Signed)
Abdominal Pain °Many things can cause abdominal pain. Usually, abdominal pain is not caused by a disease and will improve without treatment. It can often be observed and treated at home. Your health care provider will do a physical exam and possibly order blood tests and X-rays to help determine the seriousness of your pain. However, in many cases, more time must pass before a clear cause of the pain can be found. Before that point, your health care provider may not know if you need more testing or further treatment. °HOME CARE INSTRUCTIONS  °Monitor your abdominal pain for any changes. The following actions may help to alleviate any discomfort you are experiencing: °· Only take over-the-counter or prescription medicines as directed by your health care provider. °· Do not take laxatives unless directed to do so by your health care provider. °· Try a clear liquid diet (broth, tea, or water) as directed by your health care provider. Slowly move to a bland diet as tolerated. °SEEK MEDICAL CARE IF: °· You have unexplained abdominal pain. °· You have abdominal pain associated with nausea or diarrhea. °· You have pain when you urinate or have a bowel movement. °· You experience abdominal pain that wakes you in the night. °· You have abdominal pain that is worsened or improved by eating food. °· You have abdominal pain that is worsened with eating fatty foods. °· You have a fever. °SEEK IMMEDIATE MEDICAL CARE IF:  °· Your pain does not go away within 2 hours. °· You keep throwing up (vomiting). °· Your pain is felt only in portions of the abdomen, such as the right side or the left lower portion of the abdomen. °· You pass bloody or black tarry stools. °MAKE SURE YOU: °· Understand these instructions.   °· Will watch your condition.   °· Will get help right away if you are not doing well or get worse.   °Document Released: 03/11/2005 Document Revised: 06/06/2013 Document Reviewed: 02/08/2013 °[employer]® Patient Information  ©2015 [employer]. This information is not intended to replace advice given to you by your health care provider. Make sure you discuss any questions you have with your health care provider. °Urinary Tract Infection °Urinary tract infections (UTIs) can develop anywhere along your urinary tract. Your urinary tract is your body's drainage system for removing wastes and extra water. Your urinary tract includes two kidneys, two ureters, a bladder, and a urethra. Your kidneys are a pair of bean-shaped organs. Each kidney is about the size of your fist. They are located below your ribs, one on each side of your spine. °CAUSES °Infections are caused by microbes, which are microscopic organisms, including fungi, viruses, and bacteria. These organisms are so small that they can only be seen through a microscope. Bacteria are the microbes that most commonly cause UTIs. °SYMPTOMS  °Symptoms of UTIs may vary by age and gender of the patient and by the location of the infection. Symptoms in young women typically include a frequent and intense urge to urinate and a painful, burning feeling in the bladder or urethra during urination. Older women and men are more likely to be tired, shaky, and weak and have muscle aches and abdominal pain. A fever may mean the infection is in your kidneys. Other symptoms of a kidney infection include pain in your back or sides below the ribs, nausea, and vomiting. °DIAGNOSIS °To diagnose a UTI, your caregiver will ask you about your symptoms. Your caregiver also will ask to provide a urine sample. The urine sample   will be tested for bacteria and white blood cells. White blood cells are made by your body to help fight infection. °TREATMENT  °Typically, UTIs can be treated with medication. Because most UTIs are caused by a bacterial infection, they usually can be treated with the use of antibiotics. The choice of antibiotic and length of treatment depend on your symptoms and the type of bacteria  causing your infection. °HOME CARE INSTRUCTIONS °· If you were prescribed antibiotics, take them exactly as your caregiver instructs you. Finish the medication even if you feel better after you have only taken some of the medication. °· Drink enough water and fluids to keep your urine clear or pale yellow. °· Avoid caffeine, tea, and carbonated beverages. They tend to irritate your bladder. °· Empty your bladder often. Avoid holding urine for long periods of time. °· Empty your bladder before and after sexual intercourse. °· After a bowel movement, women should cleanse from front to back. Use each tissue only once. °SEEK MEDICAL CARE IF:  °· You have back pain. °· You develop a fever. °· Your symptoms do not begin to resolve within 3 days. °SEEK IMMEDIATE MEDICAL CARE IF:  °· You have severe back pain or lower abdominal pain. °· You develop chills. °· You have nausea or vomiting. °· You have continued burning or discomfort with urination. °MAKE SURE YOU:  °· Understand these instructions. °· Will watch your condition. °· Will get help right away if you are not doing well or get worse. °Document Released: 03/11/2005 Document Revised: 12/01/2011 Document Reviewed: 07/10/2011 °Exit[employer]® Patient Information ©2015 [employer]. This information is not intended to replace advice given to you by your health care provider. Make sure you discuss any questions you have with your health care provider. ° °

## 2015-02-11 NOTE — ED Notes (Signed)
Bib ems c/o abdominal pain, nausea and generalized weakness today. Daughter states pt had large bm with weakness after. Pt alert x2 unaware of month and year.

## 2015-02-11 NOTE — ED Notes (Signed)
Bed: WA02 Expected date:  Expected time:  Means of arrival:  Comments: Ems- 81 yo abd pain

## 2015-02-11 NOTE — ED Notes (Signed)
Informed by family that pt was able to tolerate PO contrast without issue.

## 2015-02-11 NOTE — ED Provider Notes (Signed)
CSN: [id number]     Arrival date & time 02/11/15  1631 History   First MD Initiated Contact with Patient 02/11/15 1632     Chief Complaint  Patient presents with  . Abdominal Pain     Patient is a 81 y.o. female presenting with abdominal pain. The history is provided by the patient. No language interpreter was used.  Abdominal Pain  Ms. Yvette Andrews presents for evaluation of abdominal pain. History is provided by the patient and her daughter. She's had left lower quadrant abdominal pain that started this afternoon. The pain is sharp and constant in nature. She has associated nausea. She had a large BM at home that was nonbloody, non-melanotic. No reports of fevers, vomiting, dysuria. Patient has a history of hypertension, hypothyroidism, frequent urinary tract infections. She has a prior cholecystectomy, no other abdominal surgeries.  Past Medical History  Diagnosis Date  . Hypertension   . GERD (gastroesophageal reflux disease)   . Thyroid disease    Past Surgical History  Procedure Laterality Date  . Fracture surgery      R hip  . Esophageal dilation     No family history on file. Social History  Substance Use Topics  . Smoking status: Never Smoker   . Smokeless tobacco: None  . Alcohol Use: No   OB History    No data available     Review of Systems  Gastrointestinal: Positive for abdominal pain.  All other systems reviewed and are negative.     Allergies  Codeine  Home Medications   Prior to Admission medications   Medication Sig Start Date End Date Taking? Authorizing Provider  aspirin EC 81 MG tablet Take 81 mg by mouth daily. 10/22/10  Yes Historical Provider, MD  Cholecalciferol (VITAMIN D3) 2000 UNITS capsule Take 2,000 Units by mouth every morning.    Yes Historical Provider, MD  levothyroxine (SYNTHROID, LEVOTHROID) 25 MCG tablet Take 25 mcg by mouth daily before breakfast.   Yes Historical Provider, MD  losartan (COZAAR) 50 MG tablet Take 50 mg by mouth every  morning.    Yes Historical Provider, MD  LUMIGAN 0.01 % SOLN Place 1 drop into the left eye at bedtime. 07/04/14  Yes Historical Provider, MD  nitrofurantoin (MACRODANTIN) 50 MG capsule Take 50 mg by mouth daily. 06/14/14  Yes Historical Provider, MD  oxyCODONE-acetaminophen (PERCOCET/ROXICET) 5-325 MG per tablet Take 1 tablet by mouth 3 (three) times daily as needed for moderate pain or severe pain.  07/04/14  Yes Historical Provider, MD  pantoprazole (PROTONIX) 40 MG tablet Take 40 mg by mouth daily.    Yes Historical Provider, MD  solifenacin (VESICARE) 10 MG tablet Take 10 mg by mouth daily.   Yes Historical Provider, MD  topiramate (TOPAMAX) 25 MG tablet Take 25 mg by mouth every evening.   Yes Historical Provider, MD  cephALEXin (KEFLEX) 500 MG capsule Take 1 capsule (500 mg total) by mouth 2 (two) times daily. 02/11/15   Quintella Reichert, MD  ondansetron (ZOFRAN) 4 MG tablet Take 1 tablet (4 mg total) by mouth every 6 (six) hours. 02/11/15   Quintella Reichert, MD  saccharomyces boulardii (FLORASTOR) 250 MG capsule Take 1 capsule (250 mg total) by mouth 2 (two) times daily. Patient not taking: Reported on 02/11/2015 07/11/14   Oswald Hillock, MD   BP 124/73 mmHg  Pulse 71  Temp(Src) 97.8 F (36.6 C) (Oral)  Resp 18  Wt 128 lb (58.06 kg)  SpO2 98% Physical Exam  Constitutional: She  is oriented to person, place, and time. She appears well-developed and well-nourished.  HENT:  Head: Normocephalic and atraumatic.  Cardiovascular: Normal rate and regular rhythm.   No murmur heard. Pulmonary/Chest: Effort normal and breath sounds normal. No respiratory distress.  Abdominal: Soft.  Moderate left-sided abdominal tenderness, voluntary guarding, mild epigastric and right upper quadrant tenderness.  Musculoskeletal: She exhibits no edema or tenderness.  Neurological: She is alert and oriented to person, place, and time.  Skin: Skin is warm and dry.  Psychiatric: She has a normal mood and affect. Her  behavior is normal.  Nursing note and vitals reviewed.   ED Course  Procedures (including critical care time) Labs Review Labs Reviewed  COMPREHENSIVE METABOLIC PANEL - Abnormal; Notable for the following:    Glucose, Bld 116 (*)    BUN 30 (*)    Creatinine, Ser 1.37 (*)    GFR calc non Af Amer 32 (*)    GFR calc Af Amer 38 (*)    All other components within normal limits  CBC WITH DIFFERENTIAL/PLATELET - Abnormal; Notable for the following:    WBC 11.5 (*)    MCV 101.2 (*)    Neutrophils Relative % 79 (*)    Neutro Abs 9.1 (*)    All other components within normal limits  LIPASE, BLOOD - Abnormal; Notable for the following:    Lipase 52 (*)    All other components within normal limits  URINALYSIS, ROUTINE W REFLEX MICROSCOPIC (NOT AT [facility]) - Abnormal; Notable for the following:    APPearance TURBID (*)    Hgb urine dipstick MODERATE (*)    Protein, ur 100 (*)    Nitrite POSITIVE (*)    Leukocytes, UA LARGE (*)    All other components within normal limits  URINE MICROSCOPIC-ADD ON - Abnormal; Notable for the following:    Bacteria, UA MANY (*)    All other components within normal limits  I-STAT TROPOININ, ED  I-STAT CG4 LACTIC ACID, ED    Imaging Review Ct Abdomen Pelvis Wo Contrast  02/11/2015   CLINICAL DATA:  Abdominal pain, nausea and generalized weakness.  EXAM: CT ABDOMEN AND PELVIS WITHOUT CONTRAST  TECHNIQUE: Multidetector CT imaging of the abdomen and pelvis was performed following the standard protocol without IV contrast.  COMPARISON:  07/08/2014  FINDINGS: Lower chest: There is subpleural nodular thickening in the right lung base, stable from prior exam.  Hepatobiliary: No masses or other significant abnormality identified. Common bile it is dilated, likely due to postcholecystectomy state.  Pancreas: Cystic lesion within the body of the pancreas measures 2.2 cm, stable. Main pancreatic duct is not dilated.  Spleen:  Within normal limits in size and appearance.   Adrenal Glands:  No masses identified.  Kidneys/Urinary Tract: No evidence of urolithiasis or hydronephrosis. Bilateral renal cysts are again seen.  Stomach/Bowel/Peritoneum: No evidence of wall thickening, mass, or obstruction.  Vascular/Lymphatic: No pathologically enlarged lymph nodes identified. No other significant abnormality noted. Atherosclerotic disease of the aorta and its main branches is seen.  Reproductive: No masses or other significant abnormality identified. Again seen is 1.7 cm right ovarian cyst.  Other:  None.  Musculoskeletal: No suspicious bone lesions identified. Right hip prosthesis is stable.  IMPRESSION: No acute findings within the abdomen and pelvis.  2.2 cm cystic circumscribed pancreatic body mass is stable, favor benign etiology. Follow-up in 1 year may be considered.  Stable bilateral renal cysts.  Stable 1.7 cm right ovarian cyst. Incidental finding. Attention on future follow-up.  Atherosclerotic disease of the aorta.  Right lung base subpleural nodular thickening, likely representing scarring.   Electronically Signed   By: Fidela Salisbury M.D.   On: 02/11/2015 19:30   I have personally reviewed and evaluated these images and lab results as part of my medical decision-making.   EKG Interpretation   Date/Time:  Monday February 11 2015 16:43:57 EDT Ventricular Rate:  70 PR Interval:  156 QRS Duration: 81 QT Interval:  418 QTC Calculation: 451 R Axis:   7 Text Interpretation:  Sinus rhythm Ventricular trigeminy Confirmed by  Hazle Coca [phone]) on 02/11/2015 5:42:13 PM      MDM   Final diagnoses:  Acute UTI  Left lower quadrant pain    Patient here for evaluation of left lower quadrant abdominal pain. She is feeling improved on repeat examination in the emergency department. CT scan without any acute abnormalities. Discussed with family incidental findings of ovarian cyst and pancreatic mass. UA is consistent with UTI, treated with dose of Rocephin and  prescription for Keflex. Review prior cultures in the system. Discussed home care and close return precautions.    Quintella Reichert, MD 02/12/15 [phone]

## 2015-02-13 DIAGNOSIS — K648 Other hemorrhoids: Secondary | ICD-10-CM | POA: Diagnosis not present

## 2015-02-13 DIAGNOSIS — N390 Urinary tract infection, site not specified: Secondary | ICD-10-CM | POA: Diagnosis not present

## 2015-02-27 DIAGNOSIS — R300 Dysuria: Secondary | ICD-10-CM | POA: Diagnosis not present

## 2015-03-15 DIAGNOSIS — Z23 Encounter for immunization: Secondary | ICD-10-CM | POA: Diagnosis not present

## 2015-03-15 DIAGNOSIS — N390 Urinary tract infection, site not specified: Secondary | ICD-10-CM | POA: Diagnosis not present

## 2015-03-18 DIAGNOSIS — E039 Hypothyroidism, unspecified: Secondary | ICD-10-CM | POA: Diagnosis not present

## 2015-03-18 DIAGNOSIS — I10 Essential (primary) hypertension: Secondary | ICD-10-CM | POA: Diagnosis not present

## 2015-03-18 DIAGNOSIS — R51 Headache: Secondary | ICD-10-CM | POA: Diagnosis not present

## 2015-03-18 DIAGNOSIS — R269 Unspecified abnormalities of gait and mobility: Secondary | ICD-10-CM | POA: Diagnosis not present

## 2015-03-18 DIAGNOSIS — Z1389 Encounter for screening for other disorder: Secondary | ICD-10-CM | POA: Diagnosis not present

## 2015-03-18 DIAGNOSIS — N3010 Interstitial cystitis (chronic) without hematuria: Secondary | ICD-10-CM | POA: Diagnosis not present

## 2015-03-18 DIAGNOSIS — M159 Polyosteoarthritis, unspecified: Secondary | ICD-10-CM | POA: Diagnosis not present

## 2015-03-18 DIAGNOSIS — G2589 Other specified extrapyramidal and movement disorders: Secondary | ICD-10-CM | POA: Diagnosis not present

## 2015-03-18 DIAGNOSIS — Z79899 Other long term (current) drug therapy: Secondary | ICD-10-CM | POA: Diagnosis not present

## 2015-03-18 DIAGNOSIS — N390 Urinary tract infection, site not specified: Secondary | ICD-10-CM | POA: Diagnosis not present

## 2015-03-18 DIAGNOSIS — Z0001 Encounter for general adult medical examination with abnormal findings: Secondary | ICD-10-CM | POA: Diagnosis not present

## 2015-03-18 DIAGNOSIS — E559 Vitamin D deficiency, unspecified: Secondary | ICD-10-CM | POA: Diagnosis not present

## 2015-03-26 ENCOUNTER — Emergency Department (HOSPITAL_COMMUNITY): Payer: Medicare Other

## 2015-03-26 ENCOUNTER — Emergency Department (HOSPITAL_COMMUNITY)
Admission: EM | Admit: 2015-03-26 | Discharge: 2015-03-27 | Disposition: A | Discharge status: Home / Self Care | Payer: Medicare Other | Attending: Emergency Medicine | Admitting: Emergency Medicine

## 2015-03-26 ENCOUNTER — Encounter (HOSPITAL_COMMUNITY): Payer: Self-pay

## 2015-03-26 DIAGNOSIS — S61205A Unspecified open wound of left ring finger without damage to nail, initial encounter: Secondary | ICD-10-CM | POA: Insufficient documentation

## 2015-03-26 DIAGNOSIS — Y998 Other external cause status: Secondary | ICD-10-CM | POA: Diagnosis not present

## 2015-03-26 DIAGNOSIS — E079 Disorder of thyroid, unspecified: Secondary | ICD-10-CM | POA: Insufficient documentation

## 2015-03-26 DIAGNOSIS — Y92009 Unspecified place in unspecified non-institutional (private) residence as the place of occurrence of the external cause: Secondary | ICD-10-CM | POA: Diagnosis not present

## 2015-03-26 DIAGNOSIS — IMO0002 Reserved for concepts with insufficient information to code with codable children: Secondary | ICD-10-CM

## 2015-03-26 DIAGNOSIS — S3992XA Unspecified injury of lower back, initial encounter: Secondary | ICD-10-CM | POA: Diagnosis not present

## 2015-03-26 DIAGNOSIS — S79912A Unspecified injury of left hip, initial encounter: Secondary | ICD-10-CM | POA: Insufficient documentation

## 2015-03-26 DIAGNOSIS — I10 Essential (primary) hypertension: Secondary | ICD-10-CM | POA: Diagnosis not present

## 2015-03-26 DIAGNOSIS — Y9301 Activity, walking, marching and hiking: Secondary | ICD-10-CM | POA: Insufficient documentation

## 2015-03-26 DIAGNOSIS — Z79899 Other long term (current) drug therapy: Secondary | ICD-10-CM | POA: Insufficient documentation

## 2015-03-26 DIAGNOSIS — W19XXXA Unspecified fall, initial encounter: Secondary | ICD-10-CM

## 2015-03-26 DIAGNOSIS — Z23 Encounter for immunization: Secondary | ICD-10-CM | POA: Insufficient documentation

## 2015-03-26 DIAGNOSIS — M545 Low back pain: Secondary | ICD-10-CM | POA: Diagnosis not present

## 2015-03-26 DIAGNOSIS — R52 Pain, unspecified: Secondary | ICD-10-CM

## 2015-03-26 DIAGNOSIS — S61215A Laceration without foreign body of left ring finger without damage to nail, initial encounter: Secondary | ICD-10-CM | POA: Diagnosis not present

## 2015-03-26 DIAGNOSIS — R079 Chest pain, unspecified: Secondary | ICD-10-CM | POA: Diagnosis not present

## 2015-03-26 DIAGNOSIS — M549 Dorsalgia, unspecified: Secondary | ICD-10-CM | POA: Diagnosis not present

## 2015-03-26 DIAGNOSIS — W1839XA Other fall on same level, initial encounter: Secondary | ICD-10-CM | POA: Insufficient documentation

## 2015-03-26 DIAGNOSIS — M7918 Myalgia, other site: Secondary | ICD-10-CM

## 2015-03-26 DIAGNOSIS — Z7982 Long term (current) use of aspirin: Secondary | ICD-10-CM | POA: Insufficient documentation

## 2015-03-26 DIAGNOSIS — K219 Gastro-esophageal reflux disease without esophagitis: Secondary | ICD-10-CM | POA: Insufficient documentation

## 2015-03-26 DIAGNOSIS — M25552 Pain in left hip: Secondary | ICD-10-CM | POA: Diagnosis not present

## 2015-03-26 DIAGNOSIS — M791 Myalgia: Secondary | ICD-10-CM | POA: Diagnosis not present

## 2015-03-26 DIAGNOSIS — R1084 Generalized abdominal pain: Secondary | ICD-10-CM | POA: Diagnosis not present

## 2015-03-26 DIAGNOSIS — M25512 Pain in left shoulder: Secondary | ICD-10-CM | POA: Diagnosis not present

## 2015-03-26 MED ORDER — HYDROCODONE-ACETAMINOPHEN 5-325 MG PO TABS
1.0000 | ORAL_TABLET | Freq: Once | ORAL | Status: AC
  Administered 2015-03-26: 1 via ORAL
  Filled 2015-03-26: qty 1

## 2015-03-26 MED ORDER — HYDROCODONE-ACETAMINOPHEN 5-325 MG PO TABS
ORAL_TABLET | ORAL | Status: DC
Start: 2015-03-26 — End: 2015-04-06

## 2015-03-26 MED ORDER — BACITRACIN ZINC 500 UNIT/GM EX OINT
TOPICAL_OINTMENT | Freq: Once | CUTANEOUS | Status: AC
  Administered 2015-03-26: 22:00:00 via TOPICAL
  Filled 2015-03-26: qty 0.9

## 2015-03-26 MED ORDER — TETANUS-DIPHTH-ACELL PERTUSSIS 5-2.5-18.5 LF-MCG/0.5 IM SUSP
0.5000 mL | Freq: Once | INTRAMUSCULAR | Status: AC
  Administered 2015-03-26: 0.5 mL via INTRAMUSCULAR
  Filled 2015-03-26: qty 0.5

## 2015-03-26 NOTE — ED Notes (Signed)
Pt transported by [facility] from home for fall.  Pt was walking with walker and fell onto left side onto tile floor.  Fall witness by daughter - daughter helped patient up after fall.  Pt c/o left flank and back pain.  Pt denies hitting head.  No deformities noted by EMS.

## 2015-03-26 NOTE — ED Provider Notes (Signed)
CSN: [id number]     Arrival date & time 03/26/15  1925 History   First MD Initiated Contact with Patient 03/26/15 1951     Chief Complaint  Patient presents with  . Fall    Fall from standing position approx 1hr ago.      (Consider location/radiation/quality/duration/timing/severity/associated sxs/prior Treatment) HPI   Yvette Andrews is a 81 y.o. female complaining of  severe low back and left hip pain status post slip and fall. Patient states she fell from a standing position while going forward and the walker and she slid on her left side onto a tile 4 floor. This was witnessed by her daughter. There was no head trauma, LOC, anticoagulation, cervicalgia, chest pain, shortness of breath, abdominal pain. She has been in her normal state of health, eating and drinking normally with normal urination bowel movements lately.  Past Medical History  Diagnosis Date  . Hypertension   . GERD (gastroesophageal reflux disease)   . Thyroid disease    Past Surgical History  Procedure Laterality Date  . Fracture surgery      R hip  . Esophageal dilation     No family history on file. Social History  Substance Use Topics  . Smoking status: Never Smoker   . Smokeless tobacco: None  . Alcohol Use: No   OB History    No data available     Review of Systems  10 systems reviewed and found to be negative, except as noted in the HPI.   Allergies  Codeine  Home Medications   Prior to Admission medications   Medication Sig Start Date End Date Taking? Authorizing Provider  aspirin EC 81 MG tablet Take 81 mg by mouth daily. 10/22/10  Yes Historical Provider, MD  Cholecalciferol (VITAMIN D3) 2000 UNITS capsule Take 2,000 Units by mouth every morning.    Yes Historical Provider, MD  levothyroxine (SYNTHROID, LEVOTHROID) 25 MCG tablet Take 25 mcg by mouth daily before breakfast.   Yes Historical Provider, MD  losartan (COZAAR) 50 MG tablet Take 50 mg by mouth every morning.    Yes Historical  Provider, MD  LUMIGAN 0.01 % SOLN Place 1 drop into the left eye at bedtime. 07/04/14  Yes Historical Provider, MD  nitrofurantoin (MACRODANTIN) 50 MG capsule Take 50 mg by mouth at bedtime.  06/14/14  Yes Historical Provider, MD  pantoprazole (PROTONIX) 40 MG tablet Take 40 mg by mouth daily.    Yes Historical Provider, MD  solifenacin (VESICARE) 10 MG tablet Take 10 mg by mouth daily.   Yes Historical Provider, MD  topiramate (TOPAMAX) 25 MG tablet Take 25 mg by mouth every evening.   Yes Historical Provider, MD  cephALEXin (KEFLEX) 500 MG capsule Take 1 capsule (500 mg total) by mouth 2 (two) times daily. Patient not taking: Reported on 03/26/2015 02/11/15   Quintella Reichert, MD  HYDROcodone-acetaminophen (NORCO/VICODIN) 5-325 MG tablet Take 1-2 tablets by mouth every 6 hours as needed for pain and/or cough. 03/26/15   Nic Lampe, PA-C  ondansetron (ZOFRAN) 4 MG tablet Take 1 tablet (4 mg total) by mouth every 6 (six) hours. Patient not taking: Reported on 03/26/2015 02/11/15   Quintella Reichert, MD  oxyCODONE-acetaminophen (PERCOCET/ROXICET) 5-325 MG per tablet Take 1 tablet by mouth 3 (three) times daily as needed for moderate pain or severe pain.  07/04/14   Historical Provider, MD  saccharomyces boulardii (FLORASTOR) 250 MG capsule Take 1 capsule (250 mg total) by mouth 2 (two) times daily. Patient not taking: Reported  on 02/11/2015 07/11/14   Oswald Hillock, MD   BP 148/50 mmHg  Pulse 66  Temp(Src) 97.6 F (36.4 C) (Oral)  Resp 18  SpO2 95% Physical Exam  Constitutional: She is oriented to person, place, and time. She appears well-developed and well-nourished. No distress.  HENT:  Head: Normocephalic and atraumatic.  Mouth/Throat: Oropharynx is clear and moist.  Eyes: Conjunctivae and EOM are normal. Pupils are equal, round, and reactive to light.  Neck: Normal range of motion.  No midline C-spine  tenderness to palpation or step-offs appreciated. Patient has full range of motion without  pain.  Grip strength, biceps, triceps 5/5 bilaterally;  can differentiate between pinprick and light touch bilaterally.   Cardiovascular: Normal rate, regular rhythm and intact distal pulses.   Pulmonary/Chest: Effort normal and breath sounds normal.  Abdominal: Soft. There is no tenderness.  Musculoskeletal: Normal range of motion. She exhibits tenderness.  Left ring finger with skin tear over the PIP. Patient has full range of motion, laceration does not extend into the joint.  Patient can lift both legs up off the bed, there is no deformity, rotation or shortening of the lower extremities. Slightly reduced range of motion to left shoulder, she is distally neurovascularly intact, there is no tenderness to palpation over the elbow. No snuffbox tenderness palpation bilaterally.  Neurological: She is alert and oriented to person, place, and time.  III/IV/VI-Extraocular movements intact.  Pupils reactive bilaterally. V/VII-Smile symmetric, equal eyebrow raise,  facial sensation intact VIII- Hearing grossly intact IX/X-Normal gag XI-bilateral shoulder shrug XII-midline tongue extension    Skin: She is not diaphoretic.     Psychiatric: She has a normal mood and affect.  Nursing note and vitals reviewed.   ED Course  Procedures (including critical care time) Labs Review Labs Reviewed - No data to display  Imaging Review Dg Chest 2 View  03/26/2015   CLINICAL DATA:  Recent fall while using walker with left-sided chest pain  EXAM: CHEST - 2 VIEW  COMPARISON:  05/17/2014  FINDINGS: Cardiac shadow is stable. Tortuous thoracic aorta is again noted with mild calcifications. Lungs are clear bilaterally. Healed left rib fractures are again noted and stable. No acute bony abnormality is seen.  IMPRESSION: No acute abnormality noted.   Electronically Signed   By: Inez Catalina M.D.   On: 03/26/2015 21:08   Dg Lumbar Spine Complete  03/26/2015   CLINICAL DATA:  Fall while using walker with  left-sided back pain, initial encounter  EXAM: LUMBAR SPINE - COMPLETE 4+ VIEW  COMPARISON:  02/11/2015  FINDINGS: Five lumbar type vertebral bodies are well visualized. Facet hypertrophic changes are noted. No definitive compression deformity is noted. Mild osteophytic changes are seen. Aortic calcifications are noted as well.  IMPRESSION: Mild degenerative change without acute abnormality.   Electronically Signed   By: Inez Catalina M.D.   On: 03/26/2015 21:09   Ct Pelvis Wo Contrast  03/26/2015   CLINICAL DATA:  Patient was walking with a walker and fell on the left side to the floor. Left flank pain and back pain.  EXAM: CT PELVIS WITHOUT CONTRAST  TECHNIQUE: Multidetector CT imaging of the pelvis was performed following the standard protocol without intravenous contrast.  COMPARISON:  CT abdomen and pelvis 02/11/2015  FINDINGS: There is diffuse bone demineralization. Postoperative changes with previous internal fixation of the right hip. Associated deformity of the right femoral neck and lesser trochanter with heterotopic ossification present. The left hip appears intact. No acute displaced fractures are  identified. Pelvis and sacrum appear intact. SI joints and symphysis pubis are not displaced. Degenerative changes in the lower lumbar spine and in both hips.  Soft tissue pelvis demonstrates no bladder wall thickening. Uterus is atrophic with small calcified fibroid. No pelvic mass or lymphadenopathy. Visualized colon and small bowel are not distended. Calcification of the aorta iliac vessels. Intramuscular lipoma in the right anterior hip and right posterior gluteal region.  IMPRESSION: Diffuse bone demineralization. Previous internal fixation of the right hip with old fracture deformity. No acute fracture or dislocation in the left hip.   Electronically Signed   By: Lucienne Capers M.D.   On: 03/26/2015 23:04   Dg Shoulder Left  03/26/2015   CLINICAL DATA:  81 year old female fell from standing  today. Pain. Initial encounter.  EXAM: LEFT SHOULDER - 2+ VIEW  COMPARISON:  None.  FINDINGS: Superior positioning of the left humeral head but no glenohumeral joint dislocation. Calcified loose bodies or dystrophic calcification about the left glenohumeral joint. Proximal left humerus and scapula appear to remain intact. Left clavicle intact. Chronic left sixth through eighth rib fractures. Negative visualized lung parenchyma.  IMPRESSION: No acute fracture or dislocation identified about the left shoulder.   Electronically Signed   By: Genevie Ann M.D.   On: 03/26/2015 21:06   Dg Hip Unilat With Pelvis 2-3 Views Left  03/26/2015   CLINICAL DATA:  81 year old female fell from standing today. Pain. Initial encounter.  EXAM: DG HIP (WITH OR WITHOUT PELVIS) 2-3V LEFT  COMPARISON:  CT Abdomen and Pelvis 02/11/2015.  FINDINGS: Partially visible right femur ORIF hardware. Femoral heads remain normally located. Pelvis appears stable and intact. Osteopenia. Proximal left femur appears intact. Visible right femur appear stable.  IMPRESSION: No acute fracture or dislocation identified about the left hip or pelvis.   Electronically Signed   By: Genevie Ann M.D.   On: 03/26/2015 21:08   I have personally reviewed and evaluated these images and lab results as part of my medical decision-making.   EKG Interpretation None      MDM   Final diagnoses:  Fall at home, initial encounter  Musculoskeletal pain  Laceration    Filed Vitals:   03/26/15 2013 03/26/15 2233  BP: 160/62 148/50  Pulse: 72 66  Temp: 97.6 F (36.4 C)   TempSrc: Oral   Resp: 18 18  SpO2: 95% 95%    Medications  HYDROcodone-acetaminophen (NORCO/VICODIN) 5-325 MG per tablet 1 tablet (1 tablet Oral Given 03/26/15 2027)  Tdap (BOOSTRIX) injection 0.5 mL (0.5 mLs Intramuscular Given 03/26/15 2211)  bacitracin ointment ( Topical Given 03/26/15 2218)    Yvette Andrews is a pleasant 81 y.o. female presenting with low back, left hip left shoulder  and left posterior thoracic pain status post fall at home. This was witnessed. She fell onto a tile floor. There was no head trauma. She's not anticoagulated. Patient has a small laceration on the left ring finger however she has full range of motion. Small skin tear. Patient's wound is dressed. Tetanus is updated.  X-rays of hip, shoulder, chest and low back are negative. Pain persists, will order CT to further evaluate for hairline fracture.  CT pelvis negative. Patient's daughter feels comfortable taking her home. We've had an extensive discussion of return precautions and patient will follow closely with her primary care physician.  This is a shared visit with the attending physician who personally evaluated the patient and agrees with the care plan.   Evaluation does not show pathology  that would require ongoing emergent intervention or inpatient treatment. Pt is hemodynamically stable and mentating appropriately. Discussed findings and plan with patient/guardian, who agrees with care plan. All questions answered. Return precautions discussed and outpatient follow up given.   New Prescriptions   HYDROCODONE-ACETAMINOPHEN (NORCO/VICODIN) 5-325 MG TABLET    Take 1-2 tablets by mouth every 6 hours as needed for pain and/or cough.         Monico Blitz, PA-C 03/26/15 2119  Forde Dandy, MD 03/27/15 [phone]

## 2015-03-26 NOTE — Discharge Instructions (Signed)
Take vicodin for breakthrough pain, do not drink alcohol, drive, care for children or do other critical tasks while taking vicodin. ° °Please be very careful not to fall! °The pain medication puts you at risk for falls. Please rest as much as possible and try to not stay alone.  ° °Please follow with your primary care doctor in the next 2 days for a check-up. They must obtain records for further management.  ° °Do not hesitate to return to the Emergency Department for any new, worsening or concerning symptoms.  ° ° °

## 2015-03-26 NOTE — ED Notes (Signed)
Bed: [facility] Expected date:  Expected time:  Means of arrival:  Comments: EMS/77F/L flank pain

## 2015-03-27 DIAGNOSIS — M1990 Unspecified osteoarthritis, unspecified site: Secondary | ICD-10-CM | POA: Diagnosis not present

## 2015-03-27 DIAGNOSIS — M1711 Unilateral primary osteoarthritis, right knee: Secondary | ICD-10-CM | POA: Diagnosis not present

## 2015-03-27 DIAGNOSIS — G2581 Restless legs syndrome: Secondary | ICD-10-CM | POA: Diagnosis not present

## 2015-03-27 DIAGNOSIS — E559 Vitamin D deficiency, unspecified: Secondary | ICD-10-CM | POA: Diagnosis not present

## 2015-03-27 DIAGNOSIS — G26 Extrapyramidal and movement disorders in diseases classified elsewhere: Secondary | ICD-10-CM | POA: Diagnosis not present

## 2015-03-27 DIAGNOSIS — R531 Weakness: Secondary | ICD-10-CM | POA: Diagnosis not present

## 2015-03-27 DIAGNOSIS — R269 Unspecified abnormalities of gait and mobility: Secondary | ICD-10-CM | POA: Diagnosis not present

## 2015-03-27 DIAGNOSIS — W19XXXD Unspecified fall, subsequent encounter: Secondary | ICD-10-CM | POA: Diagnosis not present

## 2015-03-27 DIAGNOSIS — M1712 Unilateral primary osteoarthritis, left knee: Secondary | ICD-10-CM | POA: Diagnosis not present

## 2015-03-29 DIAGNOSIS — M549 Dorsalgia, unspecified: Secondary | ICD-10-CM | POA: Diagnosis not present

## 2015-03-29 DIAGNOSIS — M25552 Pain in left hip: Secondary | ICD-10-CM | POA: Diagnosis not present

## 2015-03-29 DIAGNOSIS — M79604 Pain in right leg: Secondary | ICD-10-CM | POA: Diagnosis not present

## 2015-04-01 ENCOUNTER — Inpatient Hospital Stay (HOSPITAL_COMMUNITY)
Admission: EM | Admit: 2015-04-01 | Discharge: 2015-04-06 | DRG: 690 | Disposition: A | Discharge status: Skilled Nursing Facility | Payer: Medicare Other | Attending: Internal Medicine | Admitting: Internal Medicine

## 2015-04-01 ENCOUNTER — Encounter (HOSPITAL_COMMUNITY): Payer: Self-pay | Admitting: Oncology

## 2015-04-01 DIAGNOSIS — L8990 Pressure ulcer of unspecified site, unspecified stage: Secondary | ICD-10-CM | POA: Insufficient documentation

## 2015-04-01 DIAGNOSIS — N390 Urinary tract infection, site not specified: Secondary | ICD-10-CM | POA: Diagnosis not present

## 2015-04-01 DIAGNOSIS — L89152 Pressure ulcer of sacral region, stage 2: Secondary | ICD-10-CM | POA: Diagnosis not present

## 2015-04-01 DIAGNOSIS — M1712 Unilateral primary osteoarthritis, left knee: Secondary | ICD-10-CM | POA: Diagnosis not present

## 2015-04-01 DIAGNOSIS — M549 Dorsalgia, unspecified: Secondary | ICD-10-CM | POA: Diagnosis present

## 2015-04-01 DIAGNOSIS — R030 Elevated blood-pressure reading, without diagnosis of hypertension: Secondary | ICD-10-CM | POA: Diagnosis not present

## 2015-04-01 DIAGNOSIS — W1830XA Fall on same level, unspecified, initial encounter: Secondary | ICD-10-CM | POA: Diagnosis present

## 2015-04-01 DIAGNOSIS — T148XXA Other injury of unspecified body region, initial encounter: Secondary | ICD-10-CM

## 2015-04-01 DIAGNOSIS — R319 Hematuria, unspecified: Secondary | ICD-10-CM | POA: Diagnosis not present

## 2015-04-01 DIAGNOSIS — E039 Hypothyroidism, unspecified: Secondary | ICD-10-CM | POA: Diagnosis present

## 2015-04-01 DIAGNOSIS — Z823 Family history of stroke: Secondary | ICD-10-CM

## 2015-04-01 DIAGNOSIS — S32030A Wedge compression fracture of third lumbar vertebra, initial encounter for closed fracture: Secondary | ICD-10-CM | POA: Diagnosis present

## 2015-04-01 DIAGNOSIS — Z79891 Long term (current) use of opiate analgesic: Secondary | ICD-10-CM

## 2015-04-01 DIAGNOSIS — G2581 Restless legs syndrome: Secondary | ICD-10-CM | POA: Diagnosis not present

## 2015-04-01 DIAGNOSIS — G26 Extrapyramidal and movement disorders in diseases classified elsewhere: Secondary | ICD-10-CM | POA: Diagnosis not present

## 2015-04-01 DIAGNOSIS — R531 Weakness: Secondary | ICD-10-CM | POA: Diagnosis not present

## 2015-04-01 DIAGNOSIS — K219 Gastro-esophageal reflux disease without esophagitis: Secondary | ICD-10-CM | POA: Diagnosis present

## 2015-04-01 DIAGNOSIS — Z7982 Long term (current) use of aspirin: Secondary | ICD-10-CM

## 2015-04-01 DIAGNOSIS — B961 Klebsiella pneumoniae [K. pneumoniae] as the cause of diseases classified elsewhere: Secondary | ICD-10-CM | POA: Diagnosis present

## 2015-04-01 DIAGNOSIS — L89109 Pressure ulcer of unspecified part of back, unspecified stage: Secondary | ICD-10-CM | POA: Diagnosis not present

## 2015-04-01 DIAGNOSIS — W19XXXA Unspecified fall, initial encounter: Secondary | ICD-10-CM | POA: Diagnosis present

## 2015-04-01 DIAGNOSIS — N179 Acute kidney failure, unspecified: Secondary | ICD-10-CM | POA: Diagnosis present

## 2015-04-01 DIAGNOSIS — Z79899 Other long term (current) drug therapy: Secondary | ICD-10-CM

## 2015-04-01 DIAGNOSIS — Z515 Encounter for palliative care: Secondary | ICD-10-CM | POA: Insufficient documentation

## 2015-04-01 DIAGNOSIS — R32 Unspecified urinary incontinence: Secondary | ICD-10-CM | POA: Diagnosis present

## 2015-04-01 DIAGNOSIS — S32039A Unspecified fracture of third lumbar vertebra, initial encounter for closed fracture: Secondary | ICD-10-CM | POA: Diagnosis present

## 2015-04-01 DIAGNOSIS — Z885 Allergy status to narcotic agent status: Secondary | ICD-10-CM

## 2015-04-01 DIAGNOSIS — K5900 Constipation, unspecified: Secondary | ICD-10-CM | POA: Diagnosis present

## 2015-04-01 DIAGNOSIS — Z87440 Personal history of urinary (tract) infections: Secondary | ICD-10-CM

## 2015-04-01 DIAGNOSIS — I10 Essential (primary) hypertension: Secondary | ICD-10-CM | POA: Diagnosis present

## 2015-04-01 DIAGNOSIS — L89502 Pressure ulcer of unspecified ankle, stage 2: Secondary | ICD-10-CM | POA: Diagnosis present

## 2015-04-01 DIAGNOSIS — Z66 Do not resuscitate: Secondary | ICD-10-CM | POA: Insufficient documentation

## 2015-04-01 DIAGNOSIS — R269 Unspecified abnormalities of gait and mobility: Secondary | ICD-10-CM | POA: Diagnosis not present

## 2015-04-01 DIAGNOSIS — Z9181 History of falling: Secondary | ICD-10-CM

## 2015-04-01 DIAGNOSIS — R109 Unspecified abdominal pain: Secondary | ICD-10-CM | POA: Diagnosis present

## 2015-04-01 DIAGNOSIS — M1711 Unilateral primary osteoarthritis, right knee: Secondary | ICD-10-CM | POA: Diagnosis not present

## 2015-04-01 HISTORY — DX: Noninfective gastroenteritis and colitis, unspecified: K52.9

## 2015-04-01 LAB — LIPASE, BLOOD: LIPASE: 50 U/L (ref 22–51)

## 2015-04-01 LAB — COMPREHENSIVE METABOLIC PANEL
ALBUMIN: 3.1 g/dL — AB (ref 3.5–5.0)
ALK PHOS: 50 U/L (ref 38–126)
ALT: 13 U/L — ABNORMAL LOW (ref 14–54)
ANION GAP: 6 (ref 5–15)
AST: 26 U/L (ref 15–41)
BILIRUBIN TOTAL: 0.7 mg/dL (ref 0.3–1.2)
BUN: 22 mg/dL — ABNORMAL HIGH (ref 6–20)
CALCIUM: 9.1 mg/dL (ref 8.9–10.3)
CO2: 25 mmol/L (ref 22–32)
Chloride: 108 mmol/L (ref 101–111)
Creatinine, Ser: 1.07 mg/dL — ABNORMAL HIGH (ref 0.44–1.00)
GFR calc non Af Amer: 44 mL/min — ABNORMAL LOW (ref >60)
GFR, EST AFRICAN AMERICAN: 51 mL/min — AB (ref >60)
GLUCOSE: 103 mg/dL — AB (ref 65–99)
POTASSIUM: 4.8 mmol/L (ref 3.5–5.1)
SODIUM: 139 mmol/L (ref 135–145)
TOTAL PROTEIN: 6.7 g/dL (ref 6.5–8.1)

## 2015-04-01 LAB — I-STAT CG4 LACTIC ACID, ED: Lactic Acid, Venous: 0.73 mmol/L (ref 0.5–2.0)

## 2015-04-01 MED ORDER — FENTANYL CITRATE (PF) 100 MCG/2ML IJ SOLN
50.0000 ug | Freq: Once | INTRAMUSCULAR | Status: AC
  Administered 2015-04-01: 50 ug via INTRAVENOUS
  Filled 2015-04-01: qty 2

## 2015-04-01 NOTE — Progress Notes (Addendum)
EDCM consulted to speak to patient and family about home health services.  Patient lives at home with her daughter Karna Christmas.  Patient has a walker, wheelchair, bedside commode and shower chair at home.  Patient's family reports patient is currently active with [facility] for home health services for PT.  Patient's daughter reports PT should b coming to patient's home tomorrow around 11am.  Patient's daughter reports the patient has increased confusion, having large amount of pain in her back to where the patient is unable to ambulate,  And that the patient is constipated.  Patient's family wants patient admitted and sent to a SNF for short term rehab.  Patient listed as having Medicare insurance, EDCM explained to patient's family Medicare guidelines in regards to SNF placement.  EDCM provided patient with list of home health agencies in [facility], explained services.  EDCM also provided patient's family with list of private duty nursing agencies and explained it is an out of pocket expense.  [facility] consult placed.  Patient's family also reports patient is unable to stand and that she has fractures on her vertebrae.  Discussed patient with EDRN.  Awaiting disposition.  No further EDCM needs at this time. Discussed patient with EDP.

## 2015-04-01 NOTE — ED Notes (Addendum)
Per EMS pt fell one week ago causing fx.  Pt presents d/t uncontrolled pain.  Pt is from home w/ home health help.  Stage II decub to sacrum.  Pt is mildly confused at this time, unable to tell me her birthday per EMS they believe pt's family may have given pt percocet prior to their arrival.

## 2015-04-01 NOTE — Progress Notes (Addendum)
If patient is discharged home this evening please order:  Home Health with Face to Face for RN, PT, OT, Aide and Education officer, museum.  Please also order:  [facility] Bed with air overlay mattress, and over bed table.  Thank you.

## 2015-04-01 NOTE — ED Notes (Signed)
Bed: [facility] Expected date:  Expected time:  Means of arrival:  Comments: EMS 81 yo fall 1 week ago-unable to walk

## 2015-04-01 NOTE — ED Provider Notes (Signed)
CSN: [id number]     Arrival date & time 04/01/15  2034 History   First MD Initiated Contact with Patient 04/01/15 2147     Chief Complaint  Patient presents with  . Fall    Uncontrolled pain     (Consider location/radiation/quality/duration/timing/severity/associated sxs/prior Treatment) Patient is a 81 y.o. female presenting with fall.  Fall    History provided by the daughter. Yvette Andrews is a 81 y.o. female with PMH significant for HTN, GERD, thyroid disease who presents with back pain, RLQ abdominal pain, and bed sore.  Patient fell a week ago (03/26/15) and was seen here.  Xrays were negative and CT showed no occult fractures.  Patient followed up with [facility], Dr. Veverly Fells, who compared her previous CTs.  Patient sees him for her chronic back pain.  Dr. Veverly Fells felt that she did have 3 vertebral fractures.  She is to lie flat in bed and to continue oxycodone.  She is to follow up the beginning of November.  Last BM was 1 week ago.  Denies fevers, chills, CP, SOB, cough, urinary symptoms. Patient is incontinent.   Past Medical History  Diagnosis Date  . Hypertension   . GERD (gastroesophageal reflux disease)   . Thyroid disease    Past Surgical History  Procedure Laterality Date  . Fracture surgery      R hip  . Esophageal dilation     No family history on file. Social History  Substance Use Topics  . Smoking status: Never Smoker   . Smokeless tobacco: None  . Alcohol Use: No   OB History    No data available     Review of Systems All other systems negative unless otherwise stated in HPI    Allergies  Codeine  Home Medications   Prior to Admission medications   Medication Sig Start Date End Date Taking? Authorizing Provider  aspirin EC 81 MG tablet Take 81 mg by mouth daily. 10/22/10  Yes Historical Provider, MD  Cholecalciferol (VITAMIN D3) 2000 UNITS capsule Take 2,000 Units by mouth every morning.    Yes Historical Provider, MD  levothyroxine  (SYNTHROID, LEVOTHROID) 25 MCG tablet Take 25 mcg by mouth daily before breakfast.   Yes Historical Provider, MD  losartan (COZAAR) 50 MG tablet Take 50 mg by mouth every morning.    Yes Historical Provider, MD  LUMIGAN 0.01 % SOLN Place 1 drop into the left eye at bedtime. 07/04/14  Yes Historical Provider, MD  nitrofurantoin (MACRODANTIN) 50 MG capsule Take 50 mg by mouth at bedtime.  06/14/14  Yes Historical Provider, MD  oxyCODONE-acetaminophen (PERCOCET/ROXICET) 5-325 MG per tablet Take 1 tablet by mouth 3 (three) times daily as needed for moderate pain or severe pain.  07/04/14  Yes Historical Provider, MD  pantoprazole (PROTONIX) 40 MG tablet Take 40 mg by mouth daily.    Yes Historical Provider, MD  solifenacin (VESICARE) 10 MG tablet Take 10 mg by mouth daily.   Yes Historical Provider, MD  topiramate (TOPAMAX) 25 MG tablet Take 25 mg by mouth every evening.   Yes Historical Provider, MD  cephALEXin (KEFLEX) 500 MG capsule Take 1 capsule (500 mg total) by mouth 2 (two) times daily. Patient not taking: Reported on 03/26/2015 02/11/15   Quintella Reichert, MD  HYDROcodone-acetaminophen (NORCO/VICODIN) 5-325 MG tablet Take 1-2 tablets by mouth every 6 hours as needed for pain and/or cough. Patient not taking: Reported on 04/01/2015 03/26/15   Elmyra Ricks Pisciotta, PA-C  ondansetron (ZOFRAN) 4 MG tablet Take  1 tablet (4 mg total) by mouth every 6 (six) hours. Patient not taking: Reported on 03/26/2015 02/11/15   Quintella Reichert, MD  saccharomyces boulardii (FLORASTOR) 250 MG capsule Take 1 capsule (250 mg total) by mouth 2 (two) times daily. Patient not taking: Reported on 02/11/2015 07/11/14   Oswald Hillock, MD   BP 145/70 mmHg  Pulse 69  Temp(Src) 98.7 F (37.1 C) (Oral)  Resp 18  SpO2 93% Physical Exam  Constitutional: She is oriented to person, place, and time. She appears well-developed and well-nourished.  HENT:  Head: Normocephalic and atraumatic.  Mouth/Throat: Oropharynx is clear and moist.    Eyes: Conjunctivae are normal. Pupils are equal, round, and reactive to light.  Neck: Normal range of motion. Neck supple.  Cardiovascular: Normal rate, regular rhythm and normal heart sounds.   No murmur heard. Pulmonary/Chest: Effort normal and breath sounds normal. No accessory muscle usage or stridor. No respiratory distress. She has no wheezes. She has no rhonchi. She has no rales.  Abdominal: Soft. Bowel sounds are normal. She exhibits no distension. There is no tenderness.  Musculoskeletal: Normal range of motion.  Lymphadenopathy:    She has no cervical adenopathy.  Neurological: She is alert and oriented to person, place, and time.  Speech clear without dysarthria.  Skin: Skin is warm and dry.     Psychiatric: She has a normal mood and affect. Her behavior is normal.    ED Course  Procedures (including critical care time) Labs Review Labs Reviewed  COMPREHENSIVE METABOLIC PANEL - Abnormal; Notable for the following:    Glucose, Bld 103 (*)    BUN 22 (*)    Creatinine, Ser 1.07 (*)    Albumin 3.1 (*)    ALT 13 (*)    GFR calc non Af Amer 44 (*)    GFR calc Af Amer 51 (*)    All other components within normal limits  URINALYSIS, ROUTINE W REFLEX MICROSCOPIC (NOT AT [facility]) - Abnormal; Notable for the following:    Color, Urine AMBER (*)    APPearance TURBID (*)    Hgb urine dipstick LARGE (*)    Protein, ur 100 (*)    Nitrite POSITIVE (*)    Leukocytes, UA LARGE (*)    All other components within normal limits  CBC WITH DIFFERENTIAL/PLATELET - Abnormal; Notable for the following:    RBC 3.42 (*)    Hemoglobin 11.2 (*)    HCT 33.1 (*)    All other components within normal limits  URINE CULTURE  LIPASE, BLOOD  URINE MICROSCOPIC-ADD ON  I-STAT CG4 LACTIC ACID, ED  I-STAT CG4 LACTIC ACID, ED    Imaging Review No results found. I have personally reviewed and evaluated these images and lab results as part of my medical decision-making.   EKG  Interpretation None      MDM   Final diagnoses:  Urinary tract infection with hematuria, site unspecified    Patient presents with uncontrolled back pain s/p fall and abdominal pain.  Last BM 1 week ago.  VSS, NAD.  On exam, abdomen is soft, non-tender.  Heart RRR, lungs CTAB.  Will obtain bladder scan, CBC,  lactic acid, UA, urine culture, lipase, and CMP.  -CBC shows WBC 8.8, hgb 11.2 .  CMP and lipase unremarkable. -UA shows infection.  Will start rocephin.  IVF. Culture pending.   Will admit for UTI.  Patient hand off to Dr. Randal Buba at shift change.  Gloriann Loan, PA-C 04/02/15 [id number]  Jana Half  Linker, MD 04/02/15 1507

## 2015-04-02 ENCOUNTER — Inpatient Hospital Stay (HOSPITAL_COMMUNITY): Payer: Medicare Other

## 2015-04-02 ENCOUNTER — Encounter (HOSPITAL_COMMUNITY): Payer: Self-pay | Admitting: *Deleted

## 2015-04-02 DIAGNOSIS — W1830XA Fall on same level, unspecified, initial encounter: Secondary | ICD-10-CM | POA: Diagnosis present

## 2015-04-02 DIAGNOSIS — R1311 Dysphagia, oral phase: Secondary | ICD-10-CM | POA: Diagnosis not present

## 2015-04-02 DIAGNOSIS — R2681 Unsteadiness on feet: Secondary | ICD-10-CM | POA: Diagnosis not present

## 2015-04-02 DIAGNOSIS — Z66 Do not resuscitate: Secondary | ICD-10-CM | POA: Diagnosis not present

## 2015-04-02 DIAGNOSIS — W19XXXA Unspecified fall, initial encounter: Secondary | ICD-10-CM | POA: Diagnosis present

## 2015-04-02 DIAGNOSIS — Z79891 Long term (current) use of opiate analgesic: Secondary | ICD-10-CM | POA: Diagnosis not present

## 2015-04-02 DIAGNOSIS — N390 Urinary tract infection, site not specified: Secondary | ICD-10-CM | POA: Diagnosis present

## 2015-04-02 DIAGNOSIS — S32039A Unspecified fracture of third lumbar vertebra, initial encounter for closed fracture: Secondary | ICD-10-CM | POA: Diagnosis present

## 2015-04-02 DIAGNOSIS — R41841 Cognitive communication deficit: Secondary | ICD-10-CM | POA: Diagnosis not present

## 2015-04-02 DIAGNOSIS — R1031 Right lower quadrant pain: Secondary | ICD-10-CM

## 2015-04-02 DIAGNOSIS — K5900 Constipation, unspecified: Secondary | ICD-10-CM

## 2015-04-02 DIAGNOSIS — R1030 Lower abdominal pain, unspecified: Secondary | ICD-10-CM | POA: Diagnosis not present

## 2015-04-02 DIAGNOSIS — B961 Klebsiella pneumoniae [K. pneumoniae] as the cause of diseases classified elsewhere: Secondary | ICD-10-CM | POA: Diagnosis present

## 2015-04-02 DIAGNOSIS — N179 Acute kidney failure, unspecified: Secondary | ICD-10-CM | POA: Diagnosis not present

## 2015-04-02 DIAGNOSIS — Z823 Family history of stroke: Secondary | ICD-10-CM | POA: Diagnosis not present

## 2015-04-02 DIAGNOSIS — Z885 Allergy status to narcotic agent status: Secondary | ICD-10-CM | POA: Diagnosis not present

## 2015-04-02 DIAGNOSIS — I10 Essential (primary) hypertension: Secondary | ICD-10-CM | POA: Diagnosis present

## 2015-04-02 DIAGNOSIS — L89152 Pressure ulcer of sacral region, stage 2: Secondary | ICD-10-CM | POA: Diagnosis present

## 2015-04-02 DIAGNOSIS — E039 Hypothyroidism, unspecified: Secondary | ICD-10-CM | POA: Diagnosis present

## 2015-04-02 DIAGNOSIS — M549 Dorsalgia, unspecified: Secondary | ICD-10-CM | POA: Diagnosis present

## 2015-04-02 DIAGNOSIS — R293 Abnormal posture: Secondary | ICD-10-CM | POA: Diagnosis not present

## 2015-04-02 DIAGNOSIS — R32 Unspecified urinary incontinence: Secondary | ICD-10-CM | POA: Diagnosis present

## 2015-04-02 DIAGNOSIS — S32030D Wedge compression fracture of third lumbar vertebra, subsequent encounter for fracture with routine healing: Secondary | ICD-10-CM | POA: Diagnosis not present

## 2015-04-02 DIAGNOSIS — M6281 Muscle weakness (generalized): Secondary | ICD-10-CM | POA: Diagnosis not present

## 2015-04-02 DIAGNOSIS — Z79899 Other long term (current) drug therapy: Secondary | ICD-10-CM | POA: Diagnosis not present

## 2015-04-02 DIAGNOSIS — M545 Low back pain: Secondary | ICD-10-CM | POA: Diagnosis not present

## 2015-04-02 DIAGNOSIS — K219 Gastro-esophageal reflux disease without esophagitis: Secondary | ICD-10-CM | POA: Diagnosis present

## 2015-04-02 DIAGNOSIS — Z7982 Long term (current) use of aspirin: Secondary | ICD-10-CM | POA: Diagnosis not present

## 2015-04-02 DIAGNOSIS — S32030A Wedge compression fracture of third lumbar vertebra, initial encounter for closed fracture: Secondary | ICD-10-CM | POA: Diagnosis not present

## 2015-04-02 DIAGNOSIS — S32030S Wedge compression fracture of third lumbar vertebra, sequela: Secondary | ICD-10-CM | POA: Diagnosis not present

## 2015-04-02 DIAGNOSIS — K5901 Slow transit constipation: Secondary | ICD-10-CM | POA: Diagnosis not present

## 2015-04-02 DIAGNOSIS — L89512 Pressure ulcer of right ankle, stage 2: Secondary | ICD-10-CM

## 2015-04-02 DIAGNOSIS — R531 Weakness: Secondary | ICD-10-CM | POA: Diagnosis not present

## 2015-04-02 DIAGNOSIS — L89502 Pressure ulcer of unspecified ankle, stage 2: Secondary | ICD-10-CM

## 2015-04-02 DIAGNOSIS — R109 Unspecified abdominal pain: Secondary | ICD-10-CM | POA: Diagnosis present

## 2015-04-02 DIAGNOSIS — Z9181 History of falling: Secondary | ICD-10-CM | POA: Diagnosis not present

## 2015-04-02 DIAGNOSIS — R1314 Dysphagia, pharyngoesophageal phase: Secondary | ICD-10-CM | POA: Diagnosis not present

## 2015-04-02 DIAGNOSIS — Z515 Encounter for palliative care: Secondary | ICD-10-CM | POA: Diagnosis not present

## 2015-04-02 DIAGNOSIS — Z87440 Personal history of urinary (tract) infections: Secondary | ICD-10-CM | POA: Diagnosis not present

## 2015-04-02 LAB — URINE MICROSCOPIC-ADD ON

## 2015-04-02 LAB — CBC
HEMATOCRIT: 34.6 % — AB (ref 36.0–46.0)
HEMOGLOBIN: 11.5 g/dL — AB (ref 12.0–15.0)
MCH: 32.3 pg (ref 26.0–34.0)
MCHC: 33.2 g/dL (ref 30.0–36.0)
MCV: 97.2 fL (ref 78.0–100.0)
Platelets: 187 10*3/uL (ref 150–400)
RBC: 3.56 MIL/uL — AB (ref 3.87–5.11)
RDW: 12.9 % (ref 11.5–15.5)
WBC: 7.6 10*3/uL (ref 4.0–10.5)

## 2015-04-02 LAB — CBC WITH DIFFERENTIAL/PLATELET
Basophils Absolute: 0 10*3/uL (ref 0.0–0.1)
Basophils Relative: 0 %
Eosinophils Absolute: 0.2 10*3/uL (ref 0.0–0.7)
Eosinophils Relative: 3 %
HEMATOCRIT: 33.1 % — AB (ref 36.0–46.0)
HEMOGLOBIN: 11.2 g/dL — AB (ref 12.0–15.0)
LYMPHS ABS: 1.4 10*3/uL (ref 0.7–4.0)
LYMPHS PCT: 16 %
MCH: 32.7 pg (ref 26.0–34.0)
MCHC: 33.8 g/dL (ref 30.0–36.0)
MCV: 96.8 fL (ref 78.0–100.0)
MONO ABS: 0.8 10*3/uL (ref 0.1–1.0)
MONOS PCT: 10 %
NEUTROS ABS: 6.3 10*3/uL (ref 1.7–7.7)
NEUTROS PCT: 71 %
Platelets: 177 10*3/uL (ref 150–400)
RBC: 3.42 MIL/uL — ABNORMAL LOW (ref 3.87–5.11)
RDW: 12.9 % (ref 11.5–15.5)
WBC: 8.8 10*3/uL (ref 4.0–10.5)

## 2015-04-02 LAB — URINALYSIS, ROUTINE W REFLEX MICROSCOPIC
Bilirubin Urine: NEGATIVE
GLUCOSE, UA: NEGATIVE mg/dL
Ketones, ur: NEGATIVE mg/dL
NITRITE: POSITIVE — AB
PH: 5 (ref 5.0–8.0)
Protein, ur: 100 mg/dL — AB
SPECIFIC GRAVITY, URINE: 1.018 (ref 1.005–1.030)
Urobilinogen, UA: 1 mg/dL (ref 0.0–1.0)

## 2015-04-02 LAB — BASIC METABOLIC PANEL
ANION GAP: 8 (ref 5–15)
BUN: 19 mg/dL (ref 6–20)
CO2: 24 mmol/L (ref 22–32)
Calcium: 8.7 mg/dL — ABNORMAL LOW (ref 8.9–10.3)
Chloride: 108 mmol/L (ref 101–111)
Creatinine, Ser: 0.92 mg/dL (ref 0.44–1.00)
GFR calc Af Amer: >60 mL/min (ref >60)
GFR, EST NON AFRICAN AMERICAN: 52 mL/min — AB (ref >60)
GLUCOSE: 107 mg/dL — AB (ref 65–99)
Potassium: 3.8 mmol/L (ref 3.5–5.1)
Sodium: 140 mmol/L (ref 135–145)

## 2015-04-02 LAB — PROTIME-INR
INR: 1.12 (ref 0.00–1.49)
PROTHROMBIN TIME: 14.6 s (ref 11.6–15.2)

## 2015-04-02 LAB — TSH: TSH: 2.108 u[IU]/mL (ref 0.350–4.500)

## 2015-04-02 LAB — I-STAT CG4 LACTIC ACID, ED: Lactic Acid, Venous: 0.45 mmol/L — ABNORMAL LOW (ref 0.5–2.0)

## 2015-04-02 MED ORDER — NALOXONE HCL 0.4 MG/ML IJ SOLN: 0.4000 mg | INTRAMUSCULAR | Status: DC | PRN

## 2015-04-02 MED ORDER — SODIUM CHLORIDE 0.9 % IJ SOLN: 9.0000 mL | INTRAMUSCULAR | Status: DC | PRN

## 2015-04-02 MED ORDER — SODIUM CHLORIDE 0.9 % IV BOLUS (SEPSIS)
500.0000 mL | Freq: Once | INTRAVENOUS | Status: AC
  Administered 2015-04-02: 500 mL via INTRAVENOUS

## 2015-04-02 MED ORDER — OXYCODONE-ACETAMINOPHEN 5-325 MG PO TABS
1.0000 | ORAL_TABLET | Freq: Three times a day (TID) | ORAL | Status: DC | PRN
  Administered 2015-04-02 – 2015-04-06 (×6): 1 via ORAL
  Filled 2015-04-02 (×6): qty 1

## 2015-04-02 MED ORDER — TOPIRAMATE 25 MG PO TABS
25.0000 mg | ORAL_TABLET | Freq: Every evening | ORAL | Status: DC
  Administered 2015-04-02 – 2015-04-05 (×4): 25 mg via ORAL
  Filled 2015-04-02 (×5): qty 1

## 2015-04-02 MED ORDER — LOSARTAN POTASSIUM 50 MG PO TABS: 50.0000 mg | ORAL_TABLET | Freq: Every morning | ORAL | Status: DC

## 2015-04-02 MED ORDER — DARIFENACIN HYDROBROMIDE ER 15 MG PO TB24
15.0000 mg | ORAL_TABLET | Freq: Every day | ORAL | Status: DC
  Administered 2015-04-02 – 2015-04-06 (×5): 15 mg via ORAL
  Filled 2015-04-02 (×6): qty 1

## 2015-04-02 MED ORDER — ASPIRIN EC 81 MG PO TBEC
81.0000 mg | DELAYED_RELEASE_TABLET | Freq: Every day | ORAL | Status: DC
Start: 2015-04-02 — End: 2015-04-06
  Administered 2015-04-02 – 2015-04-06 (×5): 81 mg via ORAL
  Filled 2015-04-02 (×5): qty 1

## 2015-04-02 MED ORDER — ONDANSETRON HCL 4 MG PO TABS: 4.0000 mg | ORAL_TABLET | Freq: Four times a day (QID) | ORAL | Status: DC | PRN

## 2015-04-02 MED ORDER — POLYETHYLENE GLYCOL 3350 17 G PO PACK
17.0000 g | PACK | Freq: Two times a day (BID) | ORAL | Status: AC
  Administered 2015-04-02 – 2015-04-03 (×4): 17 g via ORAL
  Filled 2015-04-02 (×4): qty 1

## 2015-04-02 MED ORDER — DIPHENHYDRAMINE HCL 50 MG/ML IJ SOLN: 12.5000 mg | Freq: Four times a day (QID) | INTRAMUSCULAR | Status: DC | PRN

## 2015-04-02 MED ORDER — FLEET ENEMA 7-19 GM/118ML RE ENEM
1.0000 | ENEMA | Freq: Once | RECTAL | Status: AC | PRN
  Administered 2015-04-02: 1 via RECTAL
  Filled 2015-04-02: qty 1

## 2015-04-02 MED ORDER — VITAMIN D 1000 UNITS PO TABS: 2000.0000 [IU] | ORAL_TABLET | Freq: Every morning | ORAL | Status: DC

## 2015-04-02 MED ORDER — MORPHINE SULFATE 2 MG/ML IV SOLN: INTRAVENOUS | Status: DC

## 2015-04-02 MED ORDER — SENNOSIDES-DOCUSATE SODIUM 8.6-50 MG PO TABS
1.0000 | ORAL_TABLET | Freq: Two times a day (BID) | ORAL | Status: DC
  Administered 2015-04-02 – 2015-04-05 (×7): 1 via ORAL
  Filled 2015-04-02 (×7): qty 1

## 2015-04-02 MED ORDER — LATANOPROST 0.005 % OP SOLN
1.0000 [drp] | Freq: Every day | OPHTHALMIC | Status: DC
  Administered 2015-04-02 – 2015-04-05 (×4): 1 [drp] via OPHTHALMIC
  Filled 2015-04-02: qty 2.5

## 2015-04-02 MED ORDER — ONDANSETRON HCL 4 MG/2ML IJ SOLN
4.0000 mg | Freq: Four times a day (QID) | INTRAMUSCULAR | Status: DC | PRN
Start: 2015-04-02 — End: 2015-04-02

## 2015-04-02 MED ORDER — POLYETHYLENE GLYCOL 3350 17 G PO PACK: 17.0000 g | PACK | Freq: Every day | ORAL | Status: DC

## 2015-04-02 MED ORDER — SODIUM CHLORIDE 0.9 % IV SOLN
INTRAVENOUS | Status: AC
  Administered 2015-04-02: 11:00:00 via INTRAVENOUS

## 2015-04-02 MED ORDER — ONDANSETRON HCL 4 MG/2ML IJ SOLN: 4.0000 mg | Freq: Four times a day (QID) | INTRAMUSCULAR | Status: DC | PRN

## 2015-04-02 MED ORDER — CEFTRIAXONE SODIUM 1 G IJ SOLR: 1.0000 g | INTRAMUSCULAR | Status: DC

## 2015-04-02 MED ORDER — LEVOTHYROXINE SODIUM 25 MCG PO TABS
25.0000 ug | ORAL_TABLET | Freq: Every day | ORAL | Status: DC
  Administered 2015-04-02 – 2015-04-06 (×5): 25 ug via ORAL
  Filled 2015-04-02 (×5): qty 1

## 2015-04-02 MED ORDER — MORPHINE SULFATE (PF) 2 MG/ML IV SOLN: 2.0000 mg | INTRAVENOUS | Status: DC | PRN

## 2015-04-02 MED ORDER — LOSARTAN POTASSIUM 50 MG PO TABS
50.0000 mg | ORAL_TABLET | Freq: Every morning | ORAL | Status: DC
  Administered 2015-04-03 – 2015-04-06 (×4): 50 mg via ORAL
  Filled 2015-04-02 (×5): qty 1

## 2015-04-02 MED ORDER — CEFTRIAXONE SODIUM 1 G IJ SOLR
1.0000 g | Freq: Once | INTRAMUSCULAR | Status: AC
  Administered 2015-04-02: 1 g via INTRAVENOUS
  Filled 2015-04-02: qty 10

## 2015-04-02 MED ORDER — ACETAMINOPHEN 650 MG RE SUPP: 650.0000 mg | Freq: Four times a day (QID) | RECTAL | Status: DC | PRN

## 2015-04-02 MED ORDER — DIPHENHYDRAMINE HCL 12.5 MG/5ML PO ELIX: 12.5000 mg | ORAL_SOLUTION | Freq: Four times a day (QID) | ORAL | Status: DC | PRN

## 2015-04-02 MED ORDER — DEXTROSE 5 % IV SOLN
1.0000 g | INTRAVENOUS | Status: DC
  Administered 2015-04-02 – 2015-04-05 (×4): 1 g via INTRAVENOUS
  Filled 2015-04-02 (×4): qty 10

## 2015-04-02 MED ORDER — SODIUM CHLORIDE 0.9 % IJ SOLN: 3.0000 mL | INTRAMUSCULAR | Status: DC | PRN

## 2015-04-02 MED ORDER — PANTOPRAZOLE SODIUM 40 MG PO TBEC
40.0000 mg | DELAYED_RELEASE_TABLET | Freq: Every day | ORAL | Status: DC
  Administered 2015-04-02 – 2015-04-06 (×5): 40 mg via ORAL
  Filled 2015-04-02 (×5): qty 1

## 2015-04-02 MED ORDER — MORPHINE SULFATE (PF) 4 MG/ML IV SOLN
4.0000 mg | INTRAVENOUS | Status: DC | PRN
  Administered 2015-04-02: 4 mg via INTRAVENOUS
  Filled 2015-04-02: qty 1

## 2015-04-02 MED ORDER — SODIUM CHLORIDE 0.9 % IV SOLN: 250.0000 mL | INTRAVENOUS | Status: DC | PRN

## 2015-04-02 MED ORDER — HEPARIN SODIUM (PORCINE) 5000 UNIT/ML IJ SOLN
5000.0000 [IU] | Freq: Three times a day (TID) | INTRAMUSCULAR | Status: DC
  Administered 2015-04-02 – 2015-04-03 (×4): 5000 [IU] via SUBCUTANEOUS
  Filled 2015-04-02 (×4): qty 1

## 2015-04-02 MED ORDER — SODIUM CHLORIDE 0.9 % IJ SOLN: 3.0000 mL | Freq: Two times a day (BID) | INTRAMUSCULAR | Status: DC

## 2015-04-02 MED ORDER — POLYETHYLENE GLYCOL 3350 17 G PO PACK: 17.0000 g | PACK | Freq: Every day | ORAL | Status: DC | PRN

## 2015-04-02 MED ORDER — ACETAMINOPHEN 325 MG PO TABS: 650.0000 mg | ORAL_TABLET | Freq: Four times a day (QID) | ORAL | Status: DC | PRN

## 2015-04-02 NOTE — Progress Notes (Signed)
[facility]  Patient Status: Active (receiving services up to time of hospitalization)  [facility] is providing the following services: RN, PT, OT and HHA  If patient discharges after hours, please call [phone].   Kristen Hayworth 04/02/2015, 10:09 AM

## 2015-04-02 NOTE — Evaluation (Signed)
Physical Therapy Evaluation Patient Details Name: Yvette Andrews MRN: [id number] DOB: 04-20-1921 Today's Date: 04/02/2015   History of Present Illness  80 yo female admitted with UTI, acute L3 compression fx. Hx of chronic back pain.   Clinical Impression  On eval, pt required Mod-Max assist for mobility-sat EOB for ~3-4 minutes with Min assist to stabilize. Pt c/o pain- R LE > back pain. Unable to attempt transfers on today. Recommend SNF. Discussed d/c plan with daughter who states she cannot manage pt's current level of care.    Follow Up Recommendations SNF    Equipment Recommendations  None recommended by PT    Recommendations for Other Services       Precautions / Restrictions Precautions Precautions: Fall Precaution Comments: back precautions for comfort (if/when pt will allow it) Restrictions Weight Bearing Restrictions: No      Mobility  Bed Mobility Overal bed mobility: Needs Assistance Bed Mobility: Rolling;Sidelying to Sit;Sit to Supine Rolling: Mod assist Sidelying to sit: Mod assist-Max assist;HOB elevated   Sit to supine: Mod assist-Max assist   General bed mobility comments: Assist for trunk and bil LEs. Utilized bedpad for scooting, positioning. Increased time.   Transfers                 General transfer comment: NT-pt unable due to back and R LE pain  Ambulation/Gait                Stairs            Wheelchair Mobility    Modified Rankin (Stroke Patients Only)       Balance Overall balance assessment: Needs assistance Sitting-balance support: Bilateral upper extremity supported;Feet supported Sitting balance-Leahy Scale: Poor   Postural control: Posterior lean                                   Pertinent Vitals/Pain Pain Assessment: Faces Faces Pain Scale: Hurts whole lot Pain Location: back and R LE Pain Intervention(s): Limited activity within patient's tolerance;Repositioned    Home Living  Family/patient expects to be discharged to:: Skilled nursing facility Living Arrangements: Children Available Help at Discharge: Family;Available 24 hours/day Type of Home: House Home Access: Ramped entrance     Home Layout: One level Home Equipment: Walker - 2 wheels;Wheelchair - [employer];Shower seat      Prior Function Level of Independence: Needs assistance         Comments: daughter assists with adls.  Pt ambulated to bathroom and depends garments changed:  incontinent     Hand Dominance        Extremity/Trunk Assessment   Upper Extremity Assessment: Generalized weakness           Lower Extremity Assessment: Generalized weakness      Cervical / Trunk Assessment: Kyphotic  Communication   Communication: No difficulties  Cognition Arousal/Alertness: Awake/alert Behavior During Therapy: WFL for tasks assessed/performed Overall Cognitive Status: Within Functional Limits for tasks assessed                      General Comments      Exercises        Assessment/Plan    PT Assessment Patient needs continued PT services  PT Diagnosis Difficulty walking;Generalized weakness;Acute pain   PT Problem List Decreased strength;Decreased range of motion;Decreased activity tolerance;Decreased balance;Decreased mobility;Pain;Decreased knowledge of use of DME;Decreased knowledge of precautions  PT Treatment Interventions DME instruction;Gait  training;Functional mobility training;Therapeutic activities;Patient/family education;Balance training;Therapeutic exercise   PT Goals (Current goals can be found in the Care Plan section) Acute Rehab PT Goals Patient Stated Goal: less pain PT Goal Formulation: With patient/family Time For Goal Achievement: 04/16/15 Potential to Achieve Goals: Fair    Frequency Min 3X/week   Barriers to discharge        Co-evaluation               End of Session   Activity Tolerance: Patient limited by  fatigue;Patient limited by pain Patient left: in bed;with call bell/phone within reach;with nursing/sitter in room           Time: 1540-1603 PT Time Calculation (min) (ACUTE ONLY): 23 min   Charges:   PT Evaluation $Initial PT Evaluation Tier I: 1 Procedure PT Treatments $Therapeutic Activity: 8-22 mins   PT G Codes:        Weston Anna, MPT Pager: [phone]

## 2015-04-02 NOTE — H&P (Addendum)
[facility] History and Physical     History and Physical:    Yvette Andrews   ONG:[id number] DOB: 01-02-1921 DOA: 04/01/2015  Referring MD/provider: Dr. Randal Buba PCP: Henrine Screws, MD   Chief Complaint: back pain  History of Present Illness:   Yvette Andrews is an 81 y.o. female with past medical history of multiple urinary tract infections, hip fracture, recently seen in the ED for fall about 1 week prior to admission multiple imaging were done and she was discharged on pain medication and follow-up with her orthopedic doctor who evaluated her imaging that showed no acute fracture. Most of history is obtained from the daughter, the relates that she comes in today as she had been laying in bed since last week because of her fall she has developed a stage I sacral ulcer and a home health nurse recommended.  She was she's been having right thigh pain and back pain. The patient denies any burning when she urinates. She hasn't had a bowel movement in a week.  In the ED When she got here to the ED a UA was done that showed a possible UTI, basic metabolic panel was done showing mild acute renal failure, CBC shows a normocytic anemia, blood cultures and urine cultures were ordered.  ROS:   ROS  Constitutional: No fever, no chills;  Appetite normal; No weight loss, no weight gain, no fatigue.   HEENT: No blurry vision, no diplopia, no pharyngitis, no dysphagia  CV: No chest pain, no palpitations, no PND, no orthopnea, no edema.   Resp: No SOB, no cough, no pleuritic pain.  GI: No nausea, no vomiting, no diarrhea, no melena, no hematochezia, no constipation, no abdominal pain.   GU: No dysuria, no hematuria, no frequency, no urgency.  MSK: No myalgias, no arthralgias.   Neuro:  No headache, no focal neurological deficits, no history of seizures.   Psych: No depression, no anxiety.   Endo: No heat intolerance, no cold intolerance, no polyuria, no polydipsia   Skin: No rashes, no skin  lesions.   Heme: No easy bruising.   Travel history: No recent travel.   Past Medical History:   Past Medical History  Diagnosis Date  . Hypertension   . GERD (gastroesophageal reflux disease)   . Thyroid disease     Past Surgical History:   Past Surgical History  Procedure Laterality Date  . Fracture surgery      R hip  . Esophageal dilation      Social History:   Social History   Social History  . Marital Status: Widowed    Spouse Name: N/A  . Number of Children: N/A  . Years of Education: N/A   Occupational History  . Not on file.   Social History Main Topics  . Smoking status: Never Smoker   . Smokeless tobacco: Not on file  . Alcohol Use: No  . Drug Use: No  . Sexual Activity: No   Other Topics Concern  . Not on file   Social History Narrative   Social History    . Marital Status: Widowed    Spouse Name: N/A    Number of Children: N/A    . Years of Education: Did not finish high school      Social History Main Topics    . Smoking status: None    . Smokeless tobacco: None    . Alcohol Use: None    . Drug Use: None    . Sexually Active: None  Currently lives with daughter after daughter's husband passed way for years ago next line no significant family history noted in either mother or father that patient's daughter knows about   No known drug allergies    Family history:   Family History  Problem Relation Age of Onset  . Kidney disease Mother   . Stroke Father     Allergies   Codeine  Current Medications:   Prior to Admission medications   Medication Sig Start Date End Date Taking? Authorizing Provider  aspirin EC 81 MG tablet Take 81 mg by mouth daily. 10/22/10  Yes Historical Provider, MD  Cholecalciferol (VITAMIN D3) 2000 UNITS capsule Take 2,000 Units by mouth every morning.    Yes Historical Provider, MD  levothyroxine (SYNTHROID, LEVOTHROID) 25 MCG tablet Take 25 mcg by mouth daily before breakfast.   Yes Historical  Provider, MD  losartan (COZAAR) 50 MG tablet Take 50 mg by mouth every morning.    Yes Historical Provider, MD  LUMIGAN 0.01 % SOLN Place 1 drop into the left eye at bedtime. 07/04/14  Yes Historical Provider, MD  nitrofurantoin (MACRODANTIN) 50 MG capsule Take 50 mg by mouth at bedtime.  06/14/14  Yes Historical Provider, MD  oxyCODONE-acetaminophen (PERCOCET/ROXICET) 5-325 MG per tablet Take 1 tablet by mouth 3 (three) times daily as needed for moderate pain or severe pain.  07/04/14  Yes Historical Provider, MD  pantoprazole (PROTONIX) 40 MG tablet Take 40 mg by mouth daily.    Yes Historical Provider, MD  solifenacin (VESICARE) 10 MG tablet Take 10 mg by mouth daily.   Yes Historical Provider, MD  topiramate (TOPAMAX) 25 MG tablet Take 25 mg by mouth every evening.   Yes Historical Provider, MD  cephALEXin (KEFLEX) 500 MG capsule Take 1 capsule (500 mg total) by mouth 2 (two) times daily. Patient not taking: Reported on 03/26/2015 02/11/15   Quintella Reichert, MD  HYDROcodone-acetaminophen (NORCO/VICODIN) 5-325 MG tablet Take 1-2 tablets by mouth every 6 hours as needed for pain and/or cough. Patient not taking: Reported on 04/01/2015 03/26/15   Elmyra Ricks Pisciotta, PA-C  ondansetron (ZOFRAN) 4 MG tablet Take 1 tablet (4 mg total) by mouth every 6 (six) hours. Patient not taking: Reported on 03/26/2015 02/11/15   Quintella Reichert, MD  saccharomyces boulardii (FLORASTOR) 250 MG capsule Take 1 capsule (250 mg total) by mouth 2 (two) times daily. Patient not taking: Reported on 02/11/2015 07/11/14   Oswald Hillock, MD    Physical Exam:   Filed Vitals:   04/02/15 0200 04/02/15 0210 04/02/15 0230 04/02/15 0535  BP: 165/62 165/62 170/68 141/93  Pulse: 70 66 74 72  Temp:    98.4 F (36.9 C)  TempSrc:    Oral  Resp:  18  18  Height:    5\' 4"  (1.626 m)  SpO2: 96% 94% 96% 98%     Physical Exam: Blood pressure 141/93, pulse 72, temperature 98.4 F (36.9 C), temperature source Oral, resp. rate 18, height  5\' 4"  (1.626 m), SpO2 98 %. Gen: No acute distress, cachectic Head: Normocephalic, atraumatic. Eyes: PERRL, EOMI, sclerae nonicteric. Mouth: Oropharynx no exudate, poor oral hygiene Neck: , no jugular venous distention. Chest: She has good air movement and clear to auscultation CV: Regular rate and rhythm with positive S1 and S2 Abdomen: Soft, diffuse tenderness but mainly in the left lower quadrant and suprapubic area Extremities: Extremities Skin: Warm and dry. Neuro: Alert and oriented times3; he keeps holding her right thigh, but able to move all  fluctuant is without any difficulties, sensation is intact throughout, cannot assess muscle strength in the right lower extremity as she is not able to position herself to the pain. That sensation since to be intact. Psych: Mood and affect normal.   Data Review:    Labs: Basic Metabolic Panel:  Recent Labs Lab 04/01/15 2252 04/02/15 0500  NA 139 140  K 4.8 3.8  CL 108 108  CO2 25 24  GLUCOSE 103* 107*  BUN 22* 19  CREATININE 1.07* 0.92  CALCIUM 9.1 8.7*   Liver Function Tests:  Recent Labs Lab 04/01/15 2252  AST 26  ALT 13*  ALKPHOS 50  BILITOT 0.7  PROT 6.7  ALBUMIN 3.1*    Recent Labs Lab 04/01/15 2252  LIPASE 50   No results for input(s): AMMONIA in the last 168 hours. CBC:  Recent Labs Lab 04/02/15 0001 04/02/15 0500  WBC 8.8 7.6  NEUTROABS 6.3  --   HGB 11.2* 11.5*  HCT 33.1* 34.6*  MCV 96.8 97.2  PLT 177 187   Cardiac Enzymes: No results for input(s): CKTOTAL, CKMB, CKMBINDEX, TROPONINI in the last 168 hours.  BNP (last 3 results) No results for input(s): PROBNP in the last 8760 hours. CBG: No results for input(s): GLUCAP in the last 168 hours.  Radiographic Studies: No results found. *I have personally reviewed the images above*  EKG: Independently reviewed. none   Assessment/Plan:  UTI (lower urinary tract infection): She has suprapubic tenderness with a UA compatible with  infectious etiology. I agree with IV empiric antibiotics urine cultures and blood cultures. She's not septic appearing her blood pressure is stable and her lactic acidosis is < 2.  Mild acute renal failure: Baseline creatinine is less than 1, when she came in it was 1.07. Will continue IV hydration and recheck in the morning. I will hold her ARB be for 24 hours can be resumed once her creatinine returned to baseline.  Essential Hypertension Continue current home regimen. Except ARB.  GERD (gastroesophageal reflux disease) Continue PPI.  Fall PT OT consult. We'll get a CT scan of the lumbar spine to rule out any compression fractures or any other fractures.  Hypothyroidism Continue Synthroid.  Abdominal pain We'll get a KUB she doesn't have a white count LFTs are within normal limits. Question if this is likely due to constipation. Lipase was normal.  Decubitus ulcer of ankle, stage 2: - We'll get a wound care consult.  Constipation - Start on MiraLAX twice a day for the next 48 hours give her Fleet enema when necessary. - KUB.  DVT prophylaxis: Heparin subcutaneous  Code Status: Full. Family Communication: Daughter Disposition Plan: Home when stable.  Time spent: 75 min  Charlynne Cousins [facility] Pager [phone]  If 7PM-7AM, please contact night-coverage [email or web address] Password [email or web address] 04/02/2015, 8:21 AM

## 2015-04-02 NOTE — Progress Notes (Signed)
Fleets enema admin as ordered, awaiting BM results, none at this time

## 2015-04-02 NOTE — Consult Note (Signed)
WOC wound consult note Reason for Consult: area of deep tissue pressure injury (DTPI) in the intra-gluteal cleft Wound type:Pressure Pressure Ulcer POA: Yes Measurement:6cm x 6cm with no depth, color is deep maroon/purple consistent with DTPI. Wound IWP:YKDX Drainage (amount, consistency, odor) None Periwound:intact, dry Dressing procedure/placement/frequency:We will employ a therapeutic mattress with low air loss feature, bilateral pressure redistribution boots (heels are intact), a vigilant turning and repositioning regimen and a soft silicone foam dressing to the area.  Please do not hesitate to reconsult if the evolution of the DTPI reveals a full thickness pressure ulcer. [facility] nursing team will not follow, but will remain available to this patient, the nursing and medical teams.  Please re-consult if needed. Thanks, Maudie Flakes, MSN, RN, [place], [place], [place] [phone])

## 2015-04-03 ENCOUNTER — Encounter (HOSPITAL_COMMUNITY): Payer: Self-pay | Admitting: Internal Medicine

## 2015-04-03 DIAGNOSIS — T148XXA Other injury of unspecified body region, initial encounter: Secondary | ICD-10-CM

## 2015-04-03 DIAGNOSIS — S32030A Wedge compression fracture of third lumbar vertebra, initial encounter for closed fracture: Secondary | ICD-10-CM | POA: Diagnosis present

## 2015-04-03 DIAGNOSIS — K5901 Slow transit constipation: Secondary | ICD-10-CM

## 2015-04-03 DIAGNOSIS — N390 Urinary tract infection, site not specified: Principal | ICD-10-CM

## 2015-04-03 DIAGNOSIS — E038 Other specified hypothyroidism: Secondary | ICD-10-CM

## 2015-04-03 DIAGNOSIS — L8990 Pressure ulcer of unspecified site, unspecified stage: Secondary | ICD-10-CM | POA: Insufficient documentation

## 2015-04-03 DIAGNOSIS — S32030D Wedge compression fracture of third lumbar vertebra, subsequent encounter for fracture with routine healing: Secondary | ICD-10-CM

## 2015-04-03 DIAGNOSIS — K219 Gastro-esophageal reflux disease without esophagitis: Secondary | ICD-10-CM

## 2015-04-03 DIAGNOSIS — T148 Other injury of unspecified body region: Secondary | ICD-10-CM

## 2015-04-03 DIAGNOSIS — N179 Acute kidney failure, unspecified: Secondary | ICD-10-CM

## 2015-04-03 LAB — BASIC METABOLIC PANEL
ANION GAP: 6 (ref 5–15)
BUN: 15 mg/dL (ref 6–20)
CALCIUM: 8.6 mg/dL — AB (ref 8.9–10.3)
CO2: 25 mmol/L (ref 22–32)
CREATININE: 0.91 mg/dL (ref 0.44–1.00)
Chloride: 108 mmol/L (ref 101–111)
GFR calc Af Amer: >60 mL/min (ref >60)
GFR calc non Af Amer: 53 mL/min — ABNORMAL LOW (ref >60)
GLUCOSE: 106 mg/dL — AB (ref 65–99)
Potassium: 3.7 mmol/L (ref 3.5–5.1)
Sodium: 139 mmol/L (ref 135–145)

## 2015-04-03 LAB — CBC
HEMATOCRIT: 32.5 % — AB (ref 36.0–46.0)
Hemoglobin: 10.7 g/dL — ABNORMAL LOW (ref 12.0–15.0)
MCH: 32.3 pg (ref 26.0–34.0)
MCHC: 32.9 g/dL (ref 30.0–36.0)
MCV: 98.2 fL (ref 78.0–100.0)
Platelets: 187 10*3/uL (ref 150–400)
RBC: 3.31 MIL/uL — ABNORMAL LOW (ref 3.87–5.11)
RDW: 13 % (ref 11.5–15.5)
WBC: 6.5 10*3/uL (ref 4.0–10.5)

## 2015-04-03 MED ORDER — ENOXAPARIN SODIUM 40 MG/0.4ML ~~LOC~~ SOLN
40.0000 mg | SUBCUTANEOUS | Status: DC
  Administered 2015-04-03 – 2015-04-05 (×3): 40 mg via SUBCUTANEOUS
  Filled 2015-04-03 (×4): qty 0.4

## 2015-04-03 MED ORDER — BISACODYL 10 MG RE SUPP
10.0000 mg | Freq: Once | RECTAL | Status: AC
  Administered 2015-04-03: 10 mg via RECTAL
  Filled 2015-04-03: qty 1

## 2015-04-03 MED ORDER — LOSARTAN POTASSIUM 50 MG PO TABS
50.0000 mg | ORAL_TABLET | Freq: Every morning | ORAL | Status: DC
Start: 2015-04-03 — End: 2015-04-03

## 2015-04-03 NOTE — Clinical Social Work Placement (Signed)
   CLINICAL SOCIAL WORK PLACEMENT  NOTE  Date:  04/03/2015  Patient Details  Name: Yvette Andrews MRN: [id number] Date of Birth: 10-31-1920  Clinical Social Work is seeking post-discharge placement for this patient at the [place] level of care (*CSW will initial, date and re-position this form in  chart as items are completed):  Yes   Patient/family provided with [place] Work Department's list of facilities offering this level of care within the geographic area requested by the patient (or if unable, by the patient's family).  Yes   Patient/family informed of their freedom to choose among providers that offer the needed level of care, that participate in Medicare, Medicaid or managed care program needed by the patient, have an available bed and are willing to accept the patient.  Yes   Patient/family informed of [facility]'s ownership interest in [facility] and [facility], as well as of the fact that they are under no obligation to receive care at these facilities.  PASRR submitted to EDS on       PASRR number received on       Existing PASRR number confirmed on 04/02/15     FL2 transmitted to all facilities in geographic area requested by pt/family on 04/02/15     FL2 transmitted to all facilities within larger geographic area on       Patient informed that his/her managed care company has contracts with or will negotiate with certain facilities, including the following:            Patient/family informed of bed offers received.  Patient chooses bed at       Physician recommends and patient chooses bed at      Patient to be transferred to   on  .  Patient to be transferred to facility by       Patient family notified on   of transfer.  Name of family member notified:        PHYSICIAN Please sign FL2     Additional Comment:    _______________________________________________ Ladell Pier, LCSW 04/03/2015, 8:47 AM

## 2015-04-03 NOTE — Consult Note (Signed)
   [facility] CM Inpatient Consult   04/03/2015  Yvette Andrews 12/23/1920 [id number]   [facility] Care Management referral received. Confirmed with inpatient Licensed CSW that patient is going to go to SNF at discharge. Will engage for [facility] if patient is going home. Will make inpatient RNCM aware.   Marthenia Rolling, MSN-Ed, RN,BSN [facility] Liaison [phone]

## 2015-04-03 NOTE — Consult Note (Signed)
Reason for Consult:Back Pain Referring Physician: Dr.Rama  Milana Yvette Andrews is an 81 y.o. female.  HPI: Patient was with her daughter and fell to the floor and injured her back.  Past Medical History  Diagnosis Date  . Hypertension   . GERD (gastroesophageal reflux disease)   . Thyroid disease   . Colitis, acute-unclear etiology 07/08/2014    Past Surgical History  Procedure Laterality Date  . Fracture surgery      R hip  . Esophageal dilation      Family History  Problem Relation Age of Onset  . Kidney disease Mother   . Stroke Father     Social History:  reports that she has never smoked. She does not have any smokeless tobacco history on file. She reports that she does not drink alcohol or use illicit drugs.  Allergies:  Allergies  Allergen Reactions  . Codeine Other (See Comments)    Upset stomach    Medications: I have reviewed the patient's current medications.  Results for orders placed or performed during the hospital encounter of 04/01/15 (from the past 48 hour(s))  Comprehensive metabolic panel     Status: Abnormal   Collection Time: 04/01/15 10:52 PM  Result Value Ref Range   Sodium 139 135 - 145 mmol/L   Potassium 4.8 3.5 - 5.1 mmol/L   Chloride 108 101 - 111 mmol/L   CO2 25 22 - 32 mmol/L   Glucose, Bld 103 (H) 65 - 99 mg/dL   BUN 22 (H) 6 - 20 mg/dL   Creatinine, Ser 1.07 (H) 0.44 - 1.00 mg/dL   Calcium 9.1 8.9 - 10.3 mg/dL   Total Protein 6.7 6.5 - 8.1 g/dL   Albumin 3.1 (L) 3.5 - 5.0 g/dL   AST 26 15 - 41 U/L   ALT 13 (L) 14 - 54 U/L   Alkaline Phosphatase 50 38 - 126 U/L   Total Bilirubin 0.7 0.3 - 1.2 mg/dL   GFR calc non Af Amer 44 (L) >60 mL/min   GFR calc Af Amer 51 (L) >60 mL/min    Comment: (NOTE) The eGFR has been calculated using the CKD EPI equation. This calculation has not been validated in all clinical situations. eGFR's persistently <60 mL/min signify possible Chronic Kidney Disease.    Anion gap 6 5 - 15  Lipase, blood      Status: None   Collection Time: 04/01/15 10:52 PM  Result Value Ref Range   Lipase 50 22 - 51 U/L  I-Stat CG4 Lactic Acid, ED     Status: None   Collection Time: 04/01/15 11:01 PM  Result Value Ref Range   Lactic Acid, Venous 0.73 0.5 - 2.0 mmol/L  CBC with Differential     Status: Abnormal   Collection Time: 04/02/15 12:01 AM  Result Value Ref Range   WBC 8.8 4.0 - 10.5 K/uL   RBC 3.42 (L) 3.87 - 5.11 MIL/uL   Hemoglobin 11.2 (L) 12.0 - 15.0 g/dL   HCT 33.1 (L) 36.0 - 46.0 %   MCV 96.8 78.0 - 100.0 fL   MCH 32.7 26.0 - 34.0 pg   MCHC 33.8 30.0 - 36.0 g/dL   RDW 12.9 11.5 - 15.5 %   Platelets 177 150 - 400 K/uL   Neutrophils Relative % 71 %   Neutro Abs 6.3 1.7 - 7.7 K/uL   Lymphocytes Relative 16 %   Lymphs Abs 1.4 0.7 - 4.0 K/uL   Monocytes Relative 10 %   Monocytes Absolute  0.8 0.1 - 1.0 K/uL   Eosinophils Relative 3 %   Eosinophils Absolute 0.2 0.0 - 0.7 K/uL   Basophils Relative 0 %   Basophils Absolute 0.0 0.0 - 0.1 K/uL  Urinalysis, Routine w reflex microscopic (not at [facility])     Status: Abnormal   Collection Time: 04/02/15 12:42 AM  Result Value Ref Range   Color, Urine AMBER (A) YELLOW    Comment: BIOCHEMICALS MAY BE AFFECTED BY COLOR   APPearance TURBID (A) CLEAR   Specific Gravity, Urine 1.018 1.005 - 1.030   pH 5.0 5.0 - 8.0   Glucose, UA NEGATIVE NEGATIVE mg/dL   Hgb urine dipstick LARGE (A) NEGATIVE   Bilirubin Urine NEGATIVE NEGATIVE   Ketones, ur NEGATIVE NEGATIVE mg/dL   Protein, ur 100 (A) NEGATIVE mg/dL   Urobilinogen, UA 1.0 0.0 - 1.0 mg/dL   Nitrite POSITIVE (A) NEGATIVE   Leukocytes, UA LARGE (A) NEGATIVE  Urine microscopic-add on     Status: None   Collection Time: 04/02/15 12:42 AM  Result Value Ref Range   WBC, UA TOO NUMEROUS TO COUNT <3 WBC/hpf   Urine-Other FIELD OBSCURED BY WBC'S   I-Stat CG4 Lactic Acid, ED     Status: Abnormal   Collection Time: 04/02/15  2:51 AM  Result Value Ref Range   Lactic Acid, Venous 0.45 (L) 0.5 - 2.0 mmol/L   TSH     Status: None   Collection Time: 04/02/15  5:00 AM  Result Value Ref Range   TSH 2.108 0.350 - 4.500 uIU/mL  Protime-INR     Status: None   Collection Time: 04/02/15  5:00 AM  Result Value Ref Range   Prothrombin Time 14.6 11.6 - 15.2 seconds   INR 1.12 0.00 - 7.35  Basic metabolic panel     Status: Abnormal   Collection Time: 04/02/15  5:00 AM  Result Value Ref Range   Sodium 140 135 - 145 mmol/L   Potassium 3.8 3.5 - 5.1 mmol/L    Comment: DELTA CHECK NOTED REPEATED TO VERIFY    Chloride 108 101 - 111 mmol/L   CO2 24 22 - 32 mmol/L   Glucose, Bld 107 (H) 65 - 99 mg/dL   BUN 19 6 - 20 mg/dL   Creatinine, Ser 0.92 0.44 - 1.00 mg/dL   Calcium 8.7 (L) 8.9 - 10.3 mg/dL   GFR calc non Af Amer 52 (L) >60 mL/min   GFR calc Af Amer >60 >60 mL/min    Comment: (NOTE) The eGFR has been calculated using the CKD EPI equation. This calculation has not been validated in all clinical situations. eGFR's persistently <60 mL/min signify possible Chronic Kidney Disease.    Anion gap 8 5 - 15  CBC     Status: Abnormal   Collection Time: 04/02/15  5:00 AM  Result Value Ref Range   WBC 7.6 4.0 - 10.5 K/uL   RBC 3.56 (L) 3.87 - 5.11 MIL/uL   Hemoglobin 11.5 (L) 12.0 - 15.0 g/dL   HCT 34.6 (L) 36.0 - 46.0 %   MCV 97.2 78.0 - 100.0 fL   MCH 32.3 26.0 - 34.0 pg   MCHC 33.2 30.0 - 36.0 g/dL   RDW 12.9 11.5 - 15.5 %   Platelets 187 150 - 400 K/uL  CBC     Status: Abnormal   Collection Time: 04/03/15  4:30 AM  Result Value Ref Range   WBC 6.5 4.0 - 10.5 K/uL   RBC 3.31 (L) 3.87 - 5.11  MIL/uL   Hemoglobin 10.7 (L) 12.0 - 15.0 g/dL   HCT 32.5 (L) 36.0 - 46.0 %   MCV 98.2 78.0 - 100.0 fL   MCH 32.3 26.0 - 34.0 pg   MCHC 32.9 30.0 - 36.0 g/dL   RDW 13.0 11.5 - 15.5 %   Platelets 187 150 - 400 K/uL  Basic metabolic panel     Status: Abnormal   Collection Time: 04/03/15  4:30 AM  Result Value Ref Range   Sodium 139 135 - 145 mmol/L   Potassium 3.7 3.5 - 5.1 mmol/L   Chloride 108  101 - 111 mmol/L   CO2 25 22 - 32 mmol/L   Glucose, Bld 106 (H) 65 - 99 mg/dL   BUN 15 6 - 20 mg/dL   Creatinine, Ser 0.91 0.44 - 1.00 mg/dL   Calcium 8.6 (L) 8.9 - 10.3 mg/dL   GFR calc non Af Amer 53 (L) >60 mL/min   GFR calc Af Amer >60 >60 mL/min    Comment: (NOTE) The eGFR has been calculated using the CKD EPI equation. This calculation has not been validated in all clinical situations. eGFR's persistently <60 mL/min signify possible Chronic Kidney Disease.    Anion gap 6 5 - 15    Abd 1 View (kub)  04/02/2015  CLINICAL DATA:  Abdominal pain EXAM: ABDOMEN - 1 VIEW COMPARISON:  None. FINDINGS: Large stool burden throughout the colon. Nonobstructive bowel gas pattern. No organomegaly or free air. Degenerative changes in the lumbar spine and hips. Hardware noted in the right hip. IMPRESSION: Large stool burden.  No obstruction or free air. Electronically Signed   By: Rolm Baptise M.D.   On: 04/02/2015 09:53   Ct Lumbar Spine Wo Contrast  04/02/2015  CLINICAL DATA:  Complains of low back pain, question fracture EXAM: CT LUMBAR SPINE WITHOUT CONTRAST TECHNIQUE: Multidetector CT imaging of the lumbar spine was performed without intravenous contrast administration. Multiplanar CT image reconstructions were also generated. COMPARISON:  Plain films 03/26/2015 FINDINGS: Mild compression fracture through the L3 vertebral body. Slight retropulsed fracture fragment along the inferior aspect of the L3 vertebral body of 2-3 mm. No additional acute bony abnormality. Disc spaces are maintained. Diffuse osteopenia. Normal alignment. IMPRESSION: Mild acute L3 compression fracture with slight retropulsed fracture fragment along the inferior vertebral body. Electronically Signed   By: Rolm Baptise M.D.   On: 04/02/2015 10:00    Review of Systems  Constitutional: Positive for malaise/fatigue.  HENT: Negative.   Eyes: Negative.   Respiratory: Negative.   Cardiovascular: Negative.   Gastrointestinal:  Negative.   Genitourinary: Positive for dysuria and frequency.  Musculoskeletal: Positive for back pain and falls.  Skin: Positive for rash.  Neurological: Positive for weakness.   Blood pressure 124/87, pulse 66, temperature 98.2 F (36.8 C), temperature source Oral, resp. rate 16, height _0  (1.626 m), SpO2 97 %. Physical Exam  Constitutional: She appears distressed.  HENT:  Head: Normocephalic.  Eyes: Pupils are equal, round, and reactive to light.  Neck: Normal range of motion.  Cardiovascular: Normal heart sounds.   Respiratory: Effort normal.  Musculoskeletal:  Back Pain.Flexion Contractures of both Legs  Neurological: She is alert.  Skin:  Pressure Sore,Old, Sacrum  Psychiatric: She has a normal mood and affect.    Assessment/Plan: Bed rest except up to bathroom with help. Whenpain subsides get her up with Pt and walker full weight bearing. Should be DCd to SNF. Case discussed with her daughter.Also have wound care to Sacrum.  Emiko Osorto A 04/03/2015, 11:14 AM

## 2015-04-03 NOTE — Clinical Social Work Note (Signed)
Clinical Social Work Assessment-Late Entry  Patient Details  Name: Yvette Andrews MRN: [id number] Date of Birth: 11/15/1920  Date of referral:  04/02/15               Reason for consult:  Discharge Planning                Permission sought to share information with:  Family Supports Permission granted to share information::  Yes, Verbal Permission Granted  Name::     Johnette Abraham  Agency::     Relationship::  daughter  Contact Information:  [phone]  Housing/Transportation Living arrangements for the past 2 months:  Single Family Home Source of Information:  Adult Children Patient Interpreter Needed:  None Criminal Activity/Legal Involvement Pertinent to Current Situation/Hospitalization:  No - Comment as needed Significant Relationships:  Adult Children Lives with:  Adult Children Do you feel safe going back to the place where you live?  No Need for family participation in patient care:  Yes (Comment)  Care giving concerns:  Pt admitted from home with pt daughter. Pt mobility limited due to compression fracture, UTI, and pain. Pt daughter does not feel that she can manage pt care at this time.   Social Worker assessment / plan:  CSW received referral for New SNF.  CSW met with pt and pt daughter at bedside. CSW introduced self and explained role. Pt daughter stated that pt lives at home with pt daughter. Pt daughter discussed that pt had a recent fall at home and pt has a compression fracture and is in a lot of pain. Pt daughter discussed that she is hopeful that pt can get some relief of her bowels and the pain. CSW discussed with pt daughter recommendation for rehab at [facility]. Pt daughter agreeable and recognizes that she cannot manage pt care at home at this time. Pt daughter stated that she is interested in [facility] as pt has been to facility in the past. Pt daughter agreeable to [facility] search.   CSW completed FL2 and initiated SNF search to [facility]. CSW to follow  up with pt daughter regarding SNF bed offers. CSW contacted [facility] and notified facility of pt interest in facility.   CSW to continue to follow to provide support and assist with pt disposition needs.    Employment status:  Retired Forensic scientist:  Medicare PT Recommendations:  [place] / Referral to community resources:  [place]  Patient/Family's Response to care:  Pt alert and oriented to person only. Pt daughter supportive and actively involved in pt care. Pt daughter recognizes need for pt to go to SNF upon discharge.   Patient/Family's Understanding of and Emotional Response to Diagnosis, Current Treatment, and Prognosis:  Pt daughter displayed knowledge surrounding pt diagnosis and was able to describe the treatment plans that MD has in place for pt medical needs.   Emotional Assessment Appearance:  Appears stated age Attitude/Demeanor/Rapport:  Other (pt appropriate) Affect (typically observed):  Quiet Orientation:  Oriented to Self Alcohol / Substance use:  Not Applicable Psych involvement (Current and /or in the community):  No (Comment)  Discharge Needs  Concerns to be addressed:  Discharge Planning Concerns Readmission within the last 30 days:  No Current discharge risk:  Physical Impairment Barriers to Discharge:  Continued Medical Work up   Lenard Simmer [phone]

## 2015-04-03 NOTE — Clinical Social Work Placement (Signed)
   CLINICAL SOCIAL WORK PLACEMENT  NOTE  Date:  04/03/2015  Patient Details  Name: Yvette Andrews MRN: [id number] Date of Birth: June 08, 1921  Clinical Social Work is seeking post-discharge placement for this patient at the [place] level of care (*CSW will initial, date and re-position this form in  chart as items are completed):  Yes   Patient/family provided with [facility] Work Department's list of facilities offering this level of care within the geographic area requested by the patient (or if unable, by the patient's family).  Yes   Patient/family informed of their freedom to choose among providers that offer the needed level of care, that participate in Medicare, Medicaid or managed care program needed by the patient, have an available bed and are willing to accept the patient.  Yes   Patient/family informed of [facility]'s ownership interest in [facility] and [facility], as well as of the fact that they are under no obligation to receive care at these facilities.  PASRR submitted to EDS on       PASRR number received on       Existing PASRR number confirmed on 04/02/15     FL2 transmitted to all facilities in geographic area requested by pt/family on 04/02/15     FL2 transmitted to all facilities within larger geographic area on       Patient informed that his/her managed care company has contracts with or will negotiate with certain facilities, including the following:        Yes   Patient/family informed of bed offers received.  Patient chooses bed at [place] [place]     Physician recommends and patient chooses bed at      Patient to be transferred to   on  .  Patient to be transferred to facility by       Patient family notified on   of transfer.  Name of family member notified:        PHYSICIAN Please sign FL2     Additional Comment:    _______________________________________________ Ladell Pier,  LCSW 04/03/2015, 11:59 AM

## 2015-04-03 NOTE — Progress Notes (Signed)
Progress Note   Yvette Andrews YQM:[id number] DOB: Jul 20, 1920 DOA: 04/01/2015 PCP: Yvette Screws, MD   Brief Narrative:   Yvette Andrews is an 81 y.o. female with a PMH of recurrent UTI, frequent falls, and hypertension who was brought to the hospital 04/01/15 for evaluation of a pressure sore upon the advice of her home health nurse. Initial evaluation in the ED showed findings consistent with possible UTI, mild acute renal failure and normocytic anemia. She also was noted to have back pain with plain radiographs showing an acute L3 compression fracture.  Assessment/Plan:   Principal Problem:   UTI (lower urinary tract infection) with abdominal pain - Continue empiric Rocephin. Follow-up urine and blood cultures.  Active Problems:   Acute L3 compression fracture (HCC) s/p fall approximately 1 week ago - PT evaluation requested. Pain management with oxycodone and morphine. - Daughter wants to discuss with Yvette Andrews, but kyphoplasty may be reasonable.  Requested consultation.    AKI (acute kidney injury) ([place]) - Hydrated overnight. ARB held.  Renal function now back to usual baseline values.    Essential hypertension - Okay to resume Cozaar.    GERD (gastroesophageal reflux disease) - Continue PPI therapy.    Hypothyroidism - Continue Synthroid.    Deep tissue pressure injury intra-gluteal cleft / pressure ulcer - Seen by wound care nurse 04/02/15. Soft silicone dressing applied. Continue pressure reduction efforts.    Constipation - Abdominal films showed a large stool burden. Bowel regimen initiated.  No results from a Fleet enema. - Dulcolax suppository today, continue bowel regimen.    DVT Prophylaxis - Lovenox ordered.  Family Communication: Daughter at bedside. Disposition Plan: SNF in 48-72 hours depending on progress. Code Status:     Code Status Orders        Start     Ordered   04/02/15 0820  Full code   Continuous     04/02/15 0832    Advance  Directive Documentation        Most Recent Value   Type of Advance Directive  Living will, Healthcare Power of Attorney [Yvette Andrews   Pre-existing out of facility DNR order (yellow form or pink MOST form)     "MOST" Form in Place?          IV Access:    Peripheral IV   Procedures and diagnostic studies:   Abd 1 View (kub)  04/02/2015  CLINICAL DATA:  Abdominal pain EXAM: ABDOMEN - 1 VIEW COMPARISON:  None. FINDINGS: Large stool burden throughout the colon. Nonobstructive bowel gas pattern. No organomegaly or free air. Degenerative changes in the lumbar spine and hips. Hardware noted in the right hip. IMPRESSION: Large stool burden.  No obstruction or free air. Electronically Signed   By: Rolm Baptise M.D.   On: 04/02/2015 09:53   Ct Lumbar Spine Wo Contrast  04/02/2015  CLINICAL DATA:  Complains of low back pain, question fracture EXAM: CT LUMBAR SPINE WITHOUT CONTRAST TECHNIQUE: Multidetector CT imaging of the lumbar spine was performed without intravenous contrast administration. Multiplanar CT image reconstructions were also generated. COMPARISON:  Plain films 03/26/2015 FINDINGS: Mild compression fracture through the L3 vertebral body. Slight retropulsed fracture fragment along the inferior aspect of the L3 vertebral body of 2-3 mm. No additional acute bony abnormality. Disc spaces are maintained. Diffuse osteopenia. Normal alignment. IMPRESSION: Mild acute L3 compression fracture with slight retropulsed fracture fragment along the inferior vertebral body. Electronically Signed   By: Rolm Baptise M.D.  On: 04/02/2015 10:00     Medical Consultants:    Orthopedic Surgery  Anti-Infectives:    Rocephin 04/02/15--->  Subjective:   Yvette Andrews has been mostly bed bound since her fall 1 week ago, according to her daughter.  She has developed a deep tissue injury/pressure sore to her sacrum as a consequence of immobility.  She has had significant back pain, and has not  moved her bowels in over a week.  She did not have any significant results from a Fleet enema last night.  Objective:    Filed Vitals:   04/02/15 1245 04/02/15 2030 04/02/15 2358 04/03/15 0635  BP: 143/88 152/61  124/87  Pulse: 61 73 76 66  Temp: 98 F (36.7 C) 98.6 F (37 C)  98.2 F (36.8 C)  TempSrc: Oral Oral  Oral  Resp: 16 16  16   Height:      SpO2: 97% 97%  97%    Intake/Output Summary (Last 24 hours) at 04/03/15 0744 Last data filed at 04/03/15 0002  Gross per 24 hour  Intake 1862.5 ml  Output      0 ml  Net 1862.5 ml   There were no vitals filed for this visit.  Exam: Gen:  NAD Cardiovascular:  RRR, No M/R/G Respiratory:  Lungs CTAB Gastrointestinal:  Abdomen soft, NT/ND, + BS Extremities:  No C/E/C   Data Reviewed:    Labs: Basic Metabolic Panel:  Recent Labs Lab 04/01/15 2252 04/02/15 0500 04/03/15 0430  NA 139 140 139  K 4.8 3.8 3.7  CL 108 108 108  CO2 25 24 25   GLUCOSE 103* 107* 106*  BUN 22* 19 15  CREATININE 1.07* 0.92 0.91  CALCIUM 9.1 8.7* 8.6*   GFR CrCl cannot be calculated (Unknown ideal weight.). Liver Function Tests:  Recent Labs Lab 04/01/15 2252  AST 26  ALT 13*  ALKPHOS 50  BILITOT 0.7  PROT 6.7  ALBUMIN 3.1*    Recent Labs Lab 04/01/15 2252  LIPASE 50   Coagulation profile  Recent Labs Lab 04/02/15 0500  INR 1.12    CBC:  Recent Labs Lab 04/02/15 0001 04/02/15 0500 04/03/15 0430  WBC 8.8 7.6 6.5  NEUTROABS 6.3  --   --   HGB 11.2* 11.5* 10.7*  HCT 33.1* 34.6* 32.5*  MCV 96.8 97.2 98.2  PLT 177 187 187   Thyroid function studies:  Recent Labs  04/02/15 0500  TSH 2.108   Sepsis Labs:  Recent Labs Lab 04/01/15 2301 04/02/15 0001 04/02/15 0251 04/02/15 0500 04/03/15 0430  WBC  --  8.8  --  7.6 6.5  LATICACIDVEN 0.73  --  0.45*  --   --    Microbiology No results found for this or any previous visit (from the past 240 hour(s)).   Medications:   . aspirin EC  81 mg Oral  Daily  . cefTRIAXone (ROCEPHIN)  IV  1 g Intravenous Q24H  . darifenacin  15 mg Oral Daily  . heparin  5,000 Units Subcutaneous 3 times per day  . latanoprost  1 drop Left Eye QHS  . levothyroxine  25 mcg Oral QAC breakfast  . losartan  50 mg Oral q morning - 10a  . pantoprazole  40 mg Oral Daily  . polyethylene glycol  17 g Oral BID  . senna-docusate  1 tablet Oral BID  . topiramate  25 mg Oral QPM   Continuous Infusions:   Time spent: 35 minutes with > 50% of time discussing current diagnostic  test results, clinical impression and plan of care with the patient and her daughter, coordination of care with Yvette Andrews.    LOS: 1 day   RAMA,CHRISTINA  [facility] Pager [phone]. If unable to reach me by pager, please call my cell phone at [phone].  *Please refer to [email or web address], password [email or web address] to get updated schedule on who will round on this patient, as hospitalists switch teams weekly. If 7PM-7AM, please contact night-coverage at [email or web address], password [email or web address] for any overnight needs.  04/03/2015, 7:44 AM

## 2015-04-03 NOTE — Progress Notes (Signed)
Nutrition Brief Note  RD contacted by RN, patient's daughter would like information on heart healthy diet for her mother to follow. When RD came to visit patient, a different daughter was in the room and she did not seem to think education was necessary as her mother is ~125 lb and is 81 years old.  RD left heart healthy diet education handout for daughter to review. Patient reports wanting to eat what she wants. RD does not see reason to place patient on restricted diet.  Wt Readings from Last 15 Encounters:  02/11/15 128 lb (58.06 kg)  07/08/14 128 lb 4.8 oz (58.196 kg)   Current diet order is regular, patient is consuming approximately 75% of meals at this time. Labs and medications reviewed.   No further nutrition interventions warranted at this time. If nutrition issues arise, please consult RD.   Clayton Bibles, MS, RD, LDN Pager: [phone] After Hours Pager: [phone]

## 2015-04-03 NOTE — Progress Notes (Signed)
CSW continuing to follow.   CSW followed up with pt daughter at bedside to provide SNF bed offers.  Pt daughter chooses bed at [facility]. Pt daughter pleased that [facility] can offer a bed as pt has been to facility in the past and pt liked the facility.   CSW contacted [facility] and notified facility of acceptance of bed offer. [facility] confirmed that they could accept pt when pt medically stable for discharge.  CSW to continue to follow to provide support and assist with pt disposition planning.  Alison Murray, MSW, [facility] Work [phone]

## 2015-04-04 DIAGNOSIS — R1030 Lower abdominal pain, unspecified: Secondary | ICD-10-CM

## 2015-04-04 DIAGNOSIS — Z66 Do not resuscitate: Secondary | ICD-10-CM

## 2015-04-04 DIAGNOSIS — R531 Weakness: Secondary | ICD-10-CM

## 2015-04-04 DIAGNOSIS — Z515 Encounter for palliative care: Secondary | ICD-10-CM

## 2015-04-04 LAB — URINE CULTURE

## 2015-04-04 MED ORDER — ACETAMINOPHEN 325 MG PO TABS
650.0000 mg | ORAL_TABLET | Freq: Four times a day (QID) | ORAL | Status: DC | PRN
  Administered 2015-04-05: 650 mg via ORAL
  Filled 2015-04-04: qty 2

## 2015-04-04 MED ORDER — MAGNESIUM HYDROXIDE 400 MG/5ML PO SUSP
960.0000 mL | Freq: Once | ORAL | Status: AC
  Administered 2015-04-04: 960 mL via RECTAL
  Filled 2015-04-04: qty 240

## 2015-04-04 NOTE — Progress Notes (Addendum)
Progress Note   Yvette Andrews QAS:[id number] DOB: May 01, 1921 DOA: 04/01/2015 PCP: Henrine Screws, MD   Brief Narrative:   Yvette Andrews is an 81 y.o. female with a PMH of recurrent UTI, frequent falls, and hypertension who was brought to the hospital 04/01/15 for evaluation of a pressure sore upon the advice of her home health nurse. Initial evaluation in the ED showed findings consistent with possible UTI, mild acute renal failure and normocytic anemia. She also was noted to have back pain with plain radiographs showing an acute L3 compression fracture.  Assessment/Plan:   Principal Problem:   UTI (lower urinary tract infection) with abdominal pain / urinary incontinence - Continue empiric Rocephin. Follow-up urine and blood cultures. - Urine cultures positive for Klebsiella pneumonia and group B strep, Rocephin sensitive. - We will remove Foley catheter prior to discharge, which was placed due to skin breakdown in the setting of immobility and urinary incontinence.  Active Problems:   Acute L3 compression fracture (HCC) s/p fall approximately 1 week ago - PT evaluation done 04/02/15, SNF recommended. Pain management with oxycodone and morphine. - Evaluated by Dr. Gladstone Lighter, kyphoplasty not recommended at this time. - Progressive ambulation when pain improved.    AKI (acute kidney injury) ([place]) - Hydrated. ARB held.  Renal function now back to usual baseline values.    Essential hypertension - Cozaar resumed 04/03/15.    GERD (gastroesophageal reflux disease) - Continue PPI therapy.    Hypothyroidism - Continue Synthroid.    Deep tissue pressure injury intra-gluteal cleft / pressure ulcer - Seen by wound care nurse 04/02/15. Soft silicone dressing applied. Continue pressure reduction efforts.    Constipation - Abdominal films showed a large stool burden. Bowel regimen initiated.  - Bowels moved 04/03/15, but only slightly.  SMOG enema given 04/04/15 without any significant  results. - We'll give one bottle of magnesium citrate now. Increase Dulcolax to 2 tablets twice a day and add daily MiraLAX.    DVT Prophylaxis - Lovenox ordered.  Family Communication: Daughter at bedside. Disposition Plan: SNF when bowels move, likely 04/06/15. Code Status:     Code Status Orders        Start     Ordered   04/02/15 0820  Full code   Continuous     04/02/15 0832    Advance Directive Documentation        Most Recent Value   Type of Advance Directive  Living will, Healthcare Power of Attorney [Terry Griffin   Pre-existing out of facility DNR order (yellow form or pink MOST form)     "MOST" Form in Place?          IV Access:    Peripheral IV   Procedures and diagnostic studies:   No results found.   Medical Consultants:    Dr. Latanya Maudlin: Orthopedic Surgery  Anti-Infectives:    Rocephin 04/02/15--->  Subjective:   Yvette Andrews still has not moved her bowels.  Reports some back pain/ leg burning.  Appetite is the same as it is when she is home. No nausea or vomiting. Still having some back pain, especially with movement.  Objective:    Filed Vitals:   04/04/15 0700 04/04/15 1355 04/04/15 2106 04/05/15 0649  BP: 155/92 167/68 160/54 151/60  Pulse: 73 66 79 67  Temp: 97.3 F (36.3 C) 98.3 F (36.8 C) 98 F (36.7 C) 97.9 F (36.6 C)  TempSrc: Oral Oral Oral Oral  Resp: 16 18 16  14  Height:      SpO2: 93% 96% 96% 96%    Intake/Output Summary (Last 24 hours) at 04/05/15 0802 Last data filed at 04/05/15 0650  Gross per 24 hour  Intake    750 ml  Output   1900 ml  Net  -1150 ml   Filed Weights    Exam: Gen:  NAD Cardiovascular:  RRR, No M/R/G Respiratory:  Lungs CTAB Gastrointestinal:  Abdomen soft, NT/ND, + BS Extremities:  No C/E/C Skin: Macerated sacrum with erythema   Data Reviewed:    Labs: Basic Metabolic Panel:  Recent Labs Lab 04/01/15 2252 04/02/15 0500 04/03/15 0430  NA 139 140 139  K 4.8 3.8  3.7  CL 108 108 108  CO2 25 24 25   GLUCOSE 103* 107* 106*  BUN 22* 19 15  CREATININE 1.07* 0.92 0.91  CALCIUM 9.1 8.7* 8.6*   GFR CrCl cannot be calculated (Unknown ideal weight.). Liver Function Tests:  Recent Labs Lab 04/01/15 2252  AST 26  ALT 13*  ALKPHOS 50  BILITOT 0.7  PROT 6.7  ALBUMIN 3.1*    Recent Labs Lab 04/01/15 2252  LIPASE 50   Coagulation profile  Recent Labs Lab 04/02/15 0500  INR 1.12    CBC:  Recent Labs Lab 04/02/15 0001 04/02/15 0500 04/03/15 0430  WBC 8.8 7.6 6.5  NEUTROABS 6.3  --   --   HGB 11.2* 11.5* 10.7*  HCT 33.1* 34.6* 32.5*  MCV 96.8 97.2 98.2  PLT 177 187 187   Sepsis Labs:  Recent Labs Lab 04/01/15 2301 04/02/15 0001 04/02/15 0251 04/02/15 0500 04/03/15 0430  WBC  --  8.8  --  7.6 6.5  LATICACIDVEN 0.73  --  0.45*  --   --    Microbiology Recent Results (from the past 240 hour(s))  Urine culture     Status: None   Collection Time: 04/02/15 12:42 AM  Result Value Ref Range Status   Specimen Description URINE, RANDOM  Final   Special Requests NONE  Final   Culture   Final    >=100,000 COLONIES/mL KLEBSIELLA PNEUMONIAE 50,000 COLONIES/mL GROUP B STREP(S.AGALACTIAE)ISOLATED TESTING AGAINST S. AGALACTIAE NOT ROUTINELY PERFORMED DUE TO PREDICTABILITY OF AMP/PEN/VAN SUSCEPTIBILITY. Performed at [facility]    Report Status 04/04/2015 FINAL  Final   Organism ID, Bacteria KLEBSIELLA PNEUMONIAE  Final      Susceptibility   Klebsiella pneumoniae - MIC*    AMPICILLIN >=32 RESISTANT Resistant     CEFAZOLIN <=4 SENSITIVE Sensitive     CEFTRIAXONE <=1 SENSITIVE Sensitive     CIPROFLOXACIN <=0.25 SENSITIVE Sensitive     GENTAMICIN <=1 SENSITIVE Sensitive     IMIPENEM <=0.25 SENSITIVE Sensitive     NITROFURANTOIN 64 INTERMEDIATE Intermediate     TRIMETH/SULFA <=20 SENSITIVE Sensitive     AMPICILLIN/SULBACTAM 4 SENSITIVE Sensitive     PIP/TAZO <=4 SENSITIVE Sensitive     * >=100,000 COLONIES/mL KLEBSIELLA  PNEUMONIAE  Culture, blood (routine x 2)     Status: None (Preliminary result)   Collection Time: 04/02/15  4:50 AM  Result Value Ref Range Status   Specimen Description BLOOD RIGHT ANTECUBITAL  Final   Special Requests BOTTLES DRAWN AEROBIC AND ANAEROBIC 5ML  Final   Culture   Final    NO GROWTH 2 DAYS Performed at [facility]    Report Status PENDING  Incomplete  Culture, blood (routine x 2)     Status: None (Preliminary result)   Collection Time: 04/02/15  5:00 AM  Result Value Ref Range Status   Specimen Description BLOOD RIGHT FOREARM  Final   Special Requests BOTTLES DRAWN AEROBIC AND ANAEROBIC 5ML  Final   Culture   Final    NO GROWTH 2 DAYS Performed at [facility]    Report Status PENDING  Incomplete     Medications:   . aspirin EC  81 mg Oral Daily  . cefTRIAXone (ROCEPHIN)  IV  1 g Intravenous Q24H  . darifenacin  15 mg Oral Daily  . enoxaparin (LOVENOX) injection  40 mg Subcutaneous Q24H  . latanoprost  1 drop Left Eye QHS  . levothyroxine  25 mcg Oral QAC breakfast  . losartan  50 mg Oral q morning - 10a  . magnesium citrate  1 Bottle Oral Once  . pantoprazole  40 mg Oral Daily  . senna-docusate  1 tablet Oral BID  . topiramate  25 mg Oral QPM   Continuous Infusions:   Time spent: 25 minutes.    LOS: 3 days   [facility] Pager [phone]. If unable to reach me by pager, please call my cell phone at [phone].  *Please refer to [email or web address], password [email or web address] to get updated schedule on who will round on this patient, as hospitalists switch teams weekly. If 7PM-7AM, please contact night-coverage at [email or web address], password [email or web address] for any overnight needs.  04/05/2015, 8:02 AM

## 2015-04-04 NOTE — Consult Note (Signed)
Consultation Note Date: 04/04/2015   Patient Name: Yvette Andrews  DOB: [birth year]  MRN: [id number]  Age / Sex: 81 y.o., female  PCP: Josetta Huddle, MD Referring Physician: Venetia Maxon Rama, MD  Reason for Consultation: Establishing goals of care    Clinical Assessment/Narrative:   81 y.o. female with past medical history of multiple urinary tract infections, hip fracture, recently seen in the ED for fall about 1 week prior to admission multiple imaging were done and she was discharged on pain medication and follow-up with her orthopedic doctor who evaluated her imaging that showed no acute fracture.   To ER from home, with  decreased ambulation and po intake, since last week because of her fall and she has developed a stage I sacral ulcer C/O  right thigh pain and back pain, and constipation. In the ED, UA was done that showed a possible UTI, basic metabolic panel was done showing mild acute renal failure, CBC shows a normocytic anemia, blood cultures and urine cultures were ordered.  Admitted for stabilization, family is faced with advanced care planning decisions and anticipatory care needs.   This NP Wadie Lessen reviewed medical records, received report from team, assessed the patient and then meet at the patient's bedside along with her daughter Johnette Abraham  to discuss diagnosis, prognosis, GOC,  and options.   A  discussion was had today regarding advanced directives.  Concepts specific to code status was had.  The difference between a aggressive medical intervention path  and a palliative comfort care path for this patient at this time was had.  Values and goals of care important to patient and family were attempted to be elicited.  Concept of  Palliative Care was discussed  Questions and concerns addressed.   Family encouraged to call with questions or concerns.  PMT will continue to support  holistically.     Contacts/Participants in Discussion: Daughter  Primary Decision Maker: daughter/  Terri Sims/ HPOA  HCPOA: yes -to bring in documentation for scanning   SUMMARY OF RECOMMENDATIONS  Treat the treatable, hopeful for improvement.  When medically stable discharge to SNF for rehabilitation with Palliative care services.  Patient will need specific attention to her potential for skin breakdown, infection and constipation.  Recommend Palliative to follow ion discharge Daughter encouraged to continue making advanced directive decisions and adjustments to enhance  patient centered care depending on patient's progress Patient is high risk for decompensation     Code Status/Advance Care Planning: Full code -to clarify once living will is reviewed.  Family strongly encouraged to consider DNR status knowing limited outcomes in similar patients      Code Status Orders        Start     Ordered   04/02/15 0820  Full code   Continuous     04/02/15 0832    Advance Directive Documentation        Most Recent Value   Type of Advance Directive  Living will, Healthcare Power of Attorney [Yvette Andrews   Pre-existing out of facility DNR order (yellow form or pink MOST form)     "MOST" Form in Place?        Other Directives:MOST Form-introduced  Symptom Management:    Pain: Utilize Tylenol as first line for pain control, Percocet seems to be making patietn sleepy  Palliative Prophylaxis:   Bowel Regimen, Frequent Pain Assessment, Oral Care, Palliative Wound Care and Turn Reposition   Psycho-social/Spiritual:   Support System: Fair-daughter is the only  family member  Additional Recommendations: Caregiving  Support/Resources and Education on Hospice  Prognosis: Unable to determine  Discharge Planning: [place] for rehab with Palliative care service follow-up   Chief Complaint/ Primary Diagnoses: Present on Admission:  . Essential  hypertension . GERD (gastroesophageal reflux disease) . UTI (lower urinary tract infection) . Hypothyroidism . Abdominal pain . (Resolved) Decubitus ulcer of ankle, stage 2 . Constipation . AKI (acute kidney injury) ([place]) . (Resolved) Urinary tract infectious disease . Compression fracture of L3 lumbar vertebra ([place])  I have reviewed the medical record, interviewed the patient and family, and examined the patient. The following aspects are pertinent.  Past Medical History  Diagnosis Date  . Hypertension   . GERD (gastroesophageal reflux disease)   . Thyroid disease   . Colitis, acute-unclear etiology 07/08/2014   Social History   Social History  . Marital Status: Widowed    Spouse Name: N/A  . Number of Children: N/A  . Years of Education: N/A   Social History Main Topics  . Smoking status: Never Smoker   . Smokeless tobacco: None  . Alcohol Use: No  . Drug Use: No  . Sexual Activity: No   Other Topics Concern  . None   Social History Narrative   Social History    . Marital Status: Widowed    Spouse Name: N/A    Number of Children: N/A    . Years of Education: Did not finish high school      Social History Main Topics    . Smoking status: None    . Smokeless tobacco: None    . Alcohol Use: None    . Drug Use: None    . Sexually Active: None       Currently lives with daughter after daughter's husband passed way for years ago next line no significant family history noted in either mother or father that patient's daughter knows about   No known drug allergies   Family History  Problem Relation Age of Onset  . Kidney disease Mother   . Stroke Father    Scheduled Meds: . aspirin EC  81 mg Oral Daily  . cefTRIAXone (ROCEPHIN)  IV  1 g Intravenous Q24H  . darifenacin  15 mg Oral Daily  . enoxaparin (LOVENOX) injection  40 mg Subcutaneous Q24H  . latanoprost  1 drop Left Eye QHS  . levothyroxine  25 mcg Oral QAC breakfast  . losartan  50 mg Oral q morning -  10a  . pantoprazole  40 mg Oral Daily  . senna-docusate  1 tablet Oral BID  . topiramate  25 mg Oral QPM   Continuous Infusions:  PRN Meds:.morphine injection, oxyCODONE-acetaminophen, polyethylene glycol Medications Prior to Admission:  Prior to Admission medications   Medication Sig Start Date End Date Taking? Authorizing Provider  aspirin EC 81 MG tablet Take 81 mg by mouth daily. 10/22/10  Yes Historical Provider, MD  Cholecalciferol (VITAMIN D3) 2000 UNITS capsule Take 2,000 Units by mouth every morning.    Yes Historical Provider, MD  levothyroxine (SYNTHROID, LEVOTHROID) 25 MCG tablet Take 25 mcg by mouth daily before breakfast.   Yes Historical Provider, MD  losartan (COZAAR) 50 MG tablet Take 50 mg by mouth every morning.    Yes Historical Provider, MD  LUMIGAN 0.01 % SOLN Place 1 drop into the left eye at bedtime. 07/04/14  Yes Historical Provider, MD  nitrofurantoin (MACRODANTIN) 50 MG capsule Take 50 mg by mouth at bedtime.  06/14/14  Yes Historical Provider, MD  oxyCODONE-acetaminophen (PERCOCET/ROXICET) 5-325 MG per tablet Take 1 tablet by mouth 3 (three) times daily as needed for moderate pain or severe pain.  07/04/14  Yes Historical Provider, MD  pantoprazole (PROTONIX) 40 MG tablet Take 40 mg by mouth daily.    Yes Historical Provider, MD  solifenacin (VESICARE) 10 MG tablet Take 10 mg by mouth daily.   Yes Historical Provider, MD  topiramate (TOPAMAX) 25 MG tablet Take 25 mg by mouth every evening.   Yes Historical Provider, MD  cephALEXin (KEFLEX) 500 MG capsule Take 1 capsule (500 mg total) by mouth 2 (two) times daily. Patient not taking: Reported on 03/26/2015 02/11/15   Quintella Reichert, MD  HYDROcodone-acetaminophen (NORCO/VICODIN) 5-325 MG tablet Take 1-2 tablets by mouth every 6 hours as needed for pain and/or cough. Patient not taking: Reported on 04/01/2015 03/26/15   Elmyra Ricks Pisciotta, PA-C  ondansetron (ZOFRAN) 4 MG tablet Take 1 tablet (4 mg total) by mouth every 6  (six) hours. Patient not taking: Reported on 03/26/2015 02/11/15   Quintella Reichert, MD  saccharomyces boulardii (FLORASTOR) 250 MG capsule Take 1 capsule (250 mg total) by mouth 2 (two) times daily. Patient not taking: Reported on 02/11/2015 07/11/14   Oswald Hillock, MD   Allergies  Allergen Reactions  . Codeine Other (See Comments)    Upset stomach   CBC:    Component Value Date/Time   WBC 6.5 04/03/2015 0430   HGB 10.7* 04/03/2015 0430   HCT 32.5* 04/03/2015 0430   PLT 187 04/03/2015 0430   MCV 98.2 04/03/2015 0430   NEUTROABS 6.3 04/02/2015 0001   LYMPHSABS 1.4 04/02/2015 0001   MONOABS 0.8 04/02/2015 0001   EOSABS 0.2 04/02/2015 0001   BASOSABS 0.0 04/02/2015 0001   Comprehensive Metabolic Panel:    Component Value Date/Time   NA 139 04/03/2015 0430   K 3.7 04/03/2015 0430   CL 108 04/03/2015 0430   CO2 25 04/03/2015 0430   BUN 15 04/03/2015 0430   CREATININE 0.91 04/03/2015 0430   GLUCOSE 106* 04/03/2015 0430   CALCIUM 8.6* 04/03/2015 0430   AST 26 04/01/2015 2252   ALT 13* 04/01/2015 2252   ALKPHOS 50 04/01/2015 2252   BILITOT 0.7 04/01/2015 2252   PROT 6.7 04/01/2015 2252   ALBUMIN 3.1* 04/01/2015 2252    Review of Systems  Constitutional: Positive for activity change.  HENT:       Dry buccal membranes, no exudate  Respiratory:       -B decreased in bases  Cardiovascular: Negative.   Genitourinary:       Incontinent of bowel and bladder, foley cath in place  Musculoskeletal:       Generalized weakness  Skin: Negative.   Neurological:       Lethargic    Physical Exam  HENT:  Head: Normocephalic.  Mouth/Throat: Oropharynx is clear and moist.  Cardiovascular: Normal rate and regular rhythm.   GI: Soft. Bowel sounds are normal.  Musculoskeletal:  weakness  Neurological:  letahrgic  Skin: Skin is warm and dry.    Vital Signs: BP 155/92 mmHg  Pulse 73  Temp(Src) 97.3 F (36.3 C) (Oral)  Resp 16  Ht 5\' 4"  (1.626 m)  Wt   SpO2 93% SpO2: Last  BM Date: 04/02/15  O2 Device:SpO2: 93 % O2 Flow Rate: .  Intake/output summary:  Intake/Output Summary (Last 24 hours) at 04/04/15 1325 Last data filed at 04/04/15 1039  Gross per 24 hour  Intake  400 ml  Output      0 ml  Net    400 ml   LBM:  BMP Latest Ref Rng 04/03/2015 04/02/2015 04/01/2015  Glucose 65 - 99 mg/dL 106(H) 107(H) 103(H)  BUN 6 - 20 mg/dL 15 19 22(H)  Creatinine 0.44 - 1.00 mg/dL 0.91 0.92 1.07(H)  Sodium 135 - 145 mmol/L 139 140 139  Potassium 3.5 - 5.1 mmol/L 3.7 3.8 4.8  Chloride 101 - 111 mmol/L 108 108 108  CO2 22 - 32 mmol/L 25 24 25   Calcium 8.9 - 10.3 mg/dL 8.6(L) 8.7(L) 9.1    Baseline Weight: Weight:  (wt 102.7 lbs with bed scale unsure if zeroed out ) Most recent weight: Weight:  (wt 102.7 lbs with bed scale unsure if zeroed out )      Palliative Assessment/Data:  Flowsheet Rows        Most Recent Value   Intake Tab    Referral Department  Hospitalist   Unit at Time of Referral  Oncology Unit   Palliative Care Primary Diagnosis  Other (Comment) [FTT]   Date Notified  04/04/15   Palliative Care Type  New Palliative care   Reason for referral  Clarify Goals of Care   Date of Admission  04/01/15   Date first seen by Palliative Care  04/04/15   # of days Palliative referral response time  0 Day(s)   # of days IP prior to Palliative referral  3   Clinical Assessment    Psychosocial & Spiritual Assessment    Palliative Care Outcomes       Additional Data Reviewed: Recent Labs     04/02/15  0500  04/03/15  0430  WBC  7.6  6.5  HGB  11.5*  10.7*  PLT  187  187  NA  140  139  BUN  19  15  CREATININE  0.92  0.91    Time In: 1200 Time Out: 1315 Time Total: 75 min Greater than 50%  of this time was spent counseling and coordinating care related to the above assessment and plan.  Signed by: Wadie Lessen, NP  Knox Royalty, NP  04/04/2015, 1:25 PM  Please contact Palliative Medicine Team phone at [phone] for questions and  concerns.

## 2015-04-04 NOTE — Care Management Important Message (Signed)
Important Message  Patient Details  Name: Annalisa Colonna MRN: [id number] Date of Birth: 05-Mar-1921   Medicare Important Message Given:  Yes-second notification given    Camillo Flaming 04/04/2015, 11:12 AMImportant Message  Patient Details  Name: Courtne Lighty MRN: [id number] Date of Birth: 08-26-1920   Medicare Important Message Given:  Yes-second notification given    Camillo Flaming 04/04/2015, 11:12 AM

## 2015-04-04 NOTE — Care Management Note (Signed)
Case Management Note  Patient Details  Name: Yvette Andrews MRN: [id number] Date of Birth: 1920/11/15  Subjective/Objective:        81 yo admitted with UTI and compression fracture           Action/Plan: From home with children  Expected Discharge Date:                  Expected Discharge Plan:  [place]  In-House Referral:  Clinical Social Work  Discharge planning Services  CM Consult  Post Acute Care Choice:    Choice offered to:     DME Arranged:    DME Agency:     HH Arranged:    [place] Agency:     Status of Service:  In process, will continue to follow  Medicare Important Message Given:  Yes-second notification given Date Medicare IM Given:    Medicare IM give by:    Date Additional Medicare IM Given:    Additional Medicare Important Message give by:     If discussed at [place] of Stay Meetings, dates discussed:    Additional Comments: Chart reviewed and CM following for DC needs. Lynnell Catalan, RN 04/04/2015, 2:28 PM

## 2015-04-04 NOTE — Progress Notes (Signed)
SMOG enema given to patient with no result yet . Will continue to monitor.

## 2015-04-04 NOTE — Consult Note (Addendum)
WOC wound follow up Wound type: Consult requested to re-assess buttocks.  Pt had a deep tissue injury which was present on admission and WOC consult was performed on 10/18.  Daughter requests that wound be re-assessed, related to recent frequent urinary incontinence.  A foley has been inserted at this time to contain urine.  Previous DTI has evolved into a stage 2 pressure injury upon assessment, and is located across sacrum and bilat inner buttocks in a butterfly shape. Daughter at bedside to assess appearance. Pressure injury was present on admission. Measurement:3X5X.1cm Wound bed: 100% pink Drainage (amount, consistency, odor) Scant amt yellow drainage, no odor Periwound: Intact skin surrounding Dressing procedure/placement/frequency: Pt is on an air mattress to decrease pressure. Foam dressing to protect and promote healing.  Discussed plan of care with daughter and she verbalizes understanding. Short-term use of a foley will provide the opportunity for wound to stay dry and increase healing potential. Please re-consult if further assistance is needed.  Thank-you,  Julien Girt MSN, [place], [place], [place], [place]

## 2015-04-04 NOTE — Progress Notes (Signed)
PT Cancellation Note  Patient Details Name: Yvette Andrews MRN: [id number] DOB: [birth year]   Cancelled Treatment:    Reason Eval/Treat Not Completed: Medical issues which prohibited therapy (recent enema.)   Claretha Cooper 04/04/2015, 3:58 PM Tresa Endo PT [phone]

## 2015-04-05 DIAGNOSIS — I10 Essential (primary) hypertension: Secondary | ICD-10-CM

## 2015-04-05 DIAGNOSIS — R319 Hematuria, unspecified: Secondary | ICD-10-CM

## 2015-04-05 MED ORDER — MAGNESIUM CITRATE PO SOLN
1.0000 | Freq: Once | ORAL | Status: AC
  Administered 2015-04-05: 1 via ORAL
  Filled 2015-04-05: qty 296

## 2015-04-05 MED ORDER — OXYCODONE-ACETAMINOPHEN 5-325 MG PO TABS: 1.0000 | ORAL_TABLET | Freq: Three times a day (TID) | ORAL | Status: AC | PRN

## 2015-04-05 MED ORDER — POLYETHYLENE GLYCOL 3350 17 G PO PACK
17.0000 g | PACK | Freq: Every day | ORAL | Status: DC
  Administered 2015-04-05: 17 g via ORAL
  Filled 2015-04-05 (×2): qty 1

## 2015-04-05 MED ORDER — POLYETHYLENE GLYCOL 3350 17 G PO PACK: 17.0000 g | PACK | Freq: Every day | ORAL | Status: AC

## 2015-04-05 MED ORDER — SENNOSIDES-DOCUSATE SODIUM 8.6-50 MG PO TABS: 2.0000 | ORAL_TABLET | Freq: Two times a day (BID) | ORAL | Status: AC

## 2015-04-05 MED ORDER — SENNOSIDES-DOCUSATE SODIUM 8.6-50 MG PO TABS
2.0000 | ORAL_TABLET | Freq: Two times a day (BID) | ORAL | Status: DC
  Filled 2015-04-05 (×2): qty 2

## 2015-04-05 MED ORDER — LOSARTAN POTASSIUM 50 MG PO TABS: 50.0000 mg | ORAL_TABLET | Freq: Every morning | ORAL | Status: DC

## 2015-04-05 NOTE — Progress Notes (Signed)
Physical Therapy Treatment Patient Details Name: Yvette Andrews MRN: [id number] DOB: [birth year] Today's Date: 04/05/2015    History of Present Illness 81 yo female admitted with UTI, acute L3 compression fx. Hx of chronic back pain. has been constipated,     PT Comments    Patient is very drowsy, did perk up with mobility to the edge of the bed, no balance, tends to push back. Appeared to be in discomfort after sitting x 8 minutes. Assisted back into bed and repositioned onto other side.   Follow Up Recommendations  SNF     Equipment Recommendations  None recommended by PT    Recommendations for Other Services       Precautions / Restrictions Precautions Precautions: Fall Precaution Comments: back precautions for comfort (if/when pt will allow it), has stg 2 pressure injury to buttocks Restrictions Weight Bearing Restrictions: No    Mobility  Bed Mobility Overal bed mobility: Needs Assistance Bed Mobility: Rolling;Sidelying to Sit;Sit to Supine Rolling: +2 for physical assistance;+2 for safety/equipment;Max assist Sidelying to sit: Max assist;+2 for physical assistance;+2 for safety/equipment;HOB elevated   Sit to supine: Max assist;+2 for physical assistance;+2 for safety/equipment   General bed mobility comments: Assist for trunk and bil LEs. Utilized bedpad for scooting, positioning. Increased time.   Transfers                 General transfer comment: unable to attempt, needs to maintain on air mattress  Ambulation/Gait                 Stairs            Wheelchair Mobility    Modified Rankin (Stroke Patients Only)       Balance Overall balance assessment: History of Falls;Needs assistance Sitting-balance support: Bilateral upper extremity supported Sitting balance-Leahy Scale: Zero Sitting balance - Comments: requires support Postural control: Posterior lean                          Cognition Arousal/Alertness:  Lethargic Behavior During Therapy: WFL for tasks assessed/performed                        Exercises      General Comments        Pertinent Vitals/Pain Faces Pain Scale: Hurts even more Pain Location: buttocks while sitting Pain Descriptors / Indicators: Discomfort;Grimacing;Guarding Pain Intervention(s): Repositioned    Home Living                      Prior Function            PT Goals (current goals can now be found in the care plan section) Progress towards PT goals: Progressing toward goals    Frequency  Min 3X/week    PT Plan Current plan remains appropriate    Co-evaluation             End of Session   Activity Tolerance: Patient limited by fatigue;Patient limited by pain Patient left: in bed;with call bell/phone within reach;with family/visitor present     Time: 1123-1200 PT Time Calculation (min) (ACUTE ONLY): 37 min  Charges:  $Therapeutic Activity: 23-37 mins                    G Codes:      Claretha Cooper 04/05/2015, 12:44 PM Tresa Endo PT [phone]

## 2015-04-06 DIAGNOSIS — L89151 Pressure ulcer of sacral region, stage 1: Secondary | ICD-10-CM | POA: Diagnosis not present

## 2015-04-06 DIAGNOSIS — N179 Acute kidney failure, unspecified: Secondary | ICD-10-CM | POA: Diagnosis not present

## 2015-04-06 DIAGNOSIS — K5901 Slow transit constipation: Secondary | ICD-10-CM | POA: Diagnosis not present

## 2015-04-06 DIAGNOSIS — R109 Unspecified abdominal pain: Secondary | ICD-10-CM | POA: Diagnosis not present

## 2015-04-06 DIAGNOSIS — I10 Essential (primary) hypertension: Secondary | ICD-10-CM | POA: Diagnosis not present

## 2015-04-06 DIAGNOSIS — S32030D Wedge compression fracture of third lumbar vertebra, subsequent encounter for fracture with routine healing: Secondary | ICD-10-CM | POA: Diagnosis not present

## 2015-04-06 DIAGNOSIS — E039 Hypothyroidism, unspecified: Secondary | ICD-10-CM | POA: Diagnosis not present

## 2015-04-06 DIAGNOSIS — L98499 Non-pressure chronic ulcer of skin of other sites with unspecified severity: Secondary | ICD-10-CM | POA: Diagnosis not present

## 2015-04-06 DIAGNOSIS — S32030S Wedge compression fracture of third lumbar vertebra, sequela: Secondary | ICD-10-CM | POA: Diagnosis not present

## 2015-04-06 DIAGNOSIS — Z9181 History of falling: Secondary | ICD-10-CM | POA: Diagnosis not present

## 2015-04-06 DIAGNOSIS — K5900 Constipation, unspecified: Secondary | ICD-10-CM | POA: Diagnosis not present

## 2015-04-06 DIAGNOSIS — N390 Urinary tract infection, site not specified: Secondary | ICD-10-CM | POA: Diagnosis not present

## 2015-04-06 DIAGNOSIS — B961 Klebsiella pneumoniae [K. pneumoniae] as the cause of diseases classified elsewhere: Secondary | ICD-10-CM | POA: Diagnosis not present

## 2015-04-06 DIAGNOSIS — R2681 Unsteadiness on feet: Secondary | ICD-10-CM | POA: Diagnosis not present

## 2015-04-06 DIAGNOSIS — R1311 Dysphagia, oral phase: Secondary | ICD-10-CM | POA: Diagnosis not present

## 2015-04-06 DIAGNOSIS — R293 Abnormal posture: Secondary | ICD-10-CM | POA: Diagnosis not present

## 2015-04-06 DIAGNOSIS — M6281 Muscle weakness (generalized): Secondary | ICD-10-CM | POA: Diagnosis not present

## 2015-04-06 DIAGNOSIS — R41841 Cognitive communication deficit: Secondary | ICD-10-CM | POA: Diagnosis not present

## 2015-04-06 DIAGNOSIS — M79604 Pain in right leg: Secondary | ICD-10-CM | POA: Diagnosis not present

## 2015-04-06 DIAGNOSIS — M79605 Pain in left leg: Secondary | ICD-10-CM | POA: Diagnosis not present

## 2015-04-06 DIAGNOSIS — R1314 Dysphagia, pharyngoesophageal phase: Secondary | ICD-10-CM | POA: Diagnosis not present

## 2015-04-06 DIAGNOSIS — R5381 Other malaise: Secondary | ICD-10-CM | POA: Diagnosis not present

## 2015-04-06 DIAGNOSIS — T148 Other injury of unspecified body region: Secondary | ICD-10-CM | POA: Diagnosis not present

## 2015-04-06 DIAGNOSIS — M4807 Spinal stenosis, lumbosacral region: Secondary | ICD-10-CM | POA: Diagnosis not present

## 2015-04-06 DIAGNOSIS — K219 Gastro-esophageal reflux disease without esophagitis: Secondary | ICD-10-CM | POA: Diagnosis not present

## 2015-04-06 NOTE — Progress Notes (Addendum)
Pt for discharge to [facility].   CSW facilitated pt discharge needs including contacting facility, faxing pt discharge information to [facility], discussing with pt daughter at bedside, providing RN phone number to call report, and provided RN phone number to call ambulance transport when pt is ready for transport.  No further social work needs identified at this time.  CSW signing off.   Addendum 10:12 am:  CSW received notification from RN that pt medically ready for discharge. Transport arranged.   CSW signing off.   Alison Murray, MSW, [place] [facility] [phone]

## 2015-04-06 NOTE — Progress Notes (Signed)
Patient discharged to SNF. Copies of discharge medications and instructions sent to facility.  Patient to be transported via [place].

## 2015-04-06 NOTE — Clinical Social Work Placement (Signed)
   CLINICAL SOCIAL WORK PLACEMENT  NOTE  Date:  04/06/2015  Patient Details  Name: Yvette Andrews MRN: [id number] Date of Birth: 08-08-1920  Clinical Social Work is seeking post-discharge placement for this patient at the [place] level of care (*CSW will initial, date and re-position this form in  chart as items are completed):  Yes   Patient/family provided with [facility] Work Department's list of facilities offering this level of care within the geographic area requested by the patient (or if unable, by the patient's family).  Yes   Patient/family informed of their freedom to choose among providers that offer the needed level of care, that participate in Medicare, Medicaid or managed care program needed by the patient, have an available bed and are willing to accept the patient.  Yes   Patient/family informed of [facility]'s ownership interest in [facility] and [facility], as well as of the fact that they are under no obligation to receive care at these facilities.  PASRR submitted to EDS on       PASRR number received on       Existing PASRR number confirmed on 04/02/15     FL2 transmitted to all facilities in geographic area requested by pt/family on 04/02/15     FL2 transmitted to all facilities within larger geographic area on       Patient informed that his/her managed care company has contracts with or will negotiate with certain facilities, including the following:        Yes   Patient/family informed of bed offers received.  Patient chooses bed at [place] [place]     Physician recommends and patient chooses bed at      Patient to be transferred to [place] on 04/06/15.  Patient to be transferred to facility by ambulance [facility])     Patient family notified on 04/06/15 of transfer.  Name of family member notified:  pt daughter, Coralyn Mark notified at bedside     PHYSICIAN Please sign FL2     Additional  Comment:    _______________________________________________ Ladell Pier, LCSW 04/06/2015, 9:47 AM

## 2015-04-06 NOTE — Discharge Summary (Signed)
Physician Discharge Summary  Laquashia Mergenthaler IDP:[id number] DOB: Dec 20, 1920 DOA: 04/01/2015  PCP: Henrine Screws, MD  Admit date: 04/01/2015 Discharge date: 04/06/2015   Recommendations for Outpatient Follow-Up:   1. The patient is being discharged to a SNF for rehabilitation.   2. Monitor closely for constipation/fecal impaction & maintain a bowel regimen. 3. Please F/U final blood cultures (negative to date).  Urine cultures + Klebsiella.   Discharge Diagnosis:   Principal Problem:    Klebsiella Pneumoniae UTI (lower urinary tract infection) Active Problems:    AKI (acute kidney injury) ([place])    Essential hypertension    GERD (gastroesophageal reflux disease)    Hypothyroidism    Abdominal pain    Constipation    Compression fracture of L3 lumbar vertebra (HCC)    Deep tissue injury of sacrum    Pressure ulcer    Urinary incontinence    Discharge disposition:  SNF: [facility].  Discharge Condition: Stable.  Diet recommendation: Regular.  Wound care: Recommend air mattress to decrease pressure. Foam dressing to sacrum to protect and promote healing. Keep skin dry with frequent perineal care given urinary incontinence.   History of Present Illness:   Yvette Andrews is an 81 y.o. female with a PMH of recurrent UTI, frequent falls, and hypertension who was brought to the hospital 04/01/15 for evaluation of a pressure sore upon the advice of her home health nurse. Initial evaluation in the ED showed findings consistent with possible UTI, mild acute renal failure and normocytic anemia. She also was noted to have back pain with plain radiographs showing an acute L3 compression fracture.   Hospital Course by Problem:   Principal Problem:  K Pneumoniae UTI (lower urinary tract infection) with abdominal pain / urinary incontinence - Treated with Rocephin x 5 days.  - Urine cultures positive for Klebsiella pneumonia and group B strep, Rocephin sensitive.  Active  Problems:  Acute L3 compression fracture (HCC) s/p fall approximately 1 week ago - PT evaluation done 04/02/15, SNF recommended. Pain management with oxycodone and morphine. - Evaluated by Dr. Gladstone Lighter, kyphoplasty not recommended at this time. - Progressive ambulation when pain improved.   AKI (acute kidney injury) ([place]) - Hydrated. ARB held. Renal function now back to usual baseline values.   Essential hypertension - Cozaar resumed 04/03/15.   GERD (gastroesophageal reflux disease) - Continue PPI therapy.   Hypothyroidism - Continue Synthroid.   Deep tissue pressure injury intra-gluteal cleft / pressure ulcer - Seen by wound care nurse 04/02/15. Soft silicone dressing applied. Continue pressure reduction efforts.   Constipation - Abdominal films showed a large stool burden. Bowel regimen initiated.  - Bowels moved 04/03/15, but only slightly. SMOG enema given 04/04/15 without any significant results. - Multiple BMs after being treated with magnesium citrate 04/05/15.   Medical Consultants:    Wound care RN   Discharge Exam:   Filed Vitals:   04/06/15 0612  BP: 139/70  Pulse: 98  Temp: 98.1 F (36.7 C)  Resp: 16   Filed Vitals:   04/05/15 0649 04/05/15 1415 04/05/15 2238 04/06/15 0612  BP: 151/60 150/64 153/87 139/70  Pulse: 67 81 85 98  Temp: 97.9 F (36.6 C) 98.2 F (36.8 C) 98.6 F (37 C) 98.1 F (36.7 C)  TempSrc: Oral Oral Oral Oral  Resp: 14 17 16 16   Height:      SpO2: 96% 99% 95% 97%    Gen:  NAD Cardiovascular:  RRR, No M/R/G Respiratory: Lungs CTAB Gastrointestinal: Abdomen soft, NT/ND with  normal active bowel sounds. Extremities: No C/E/C Skin: Sacrum erythematous with maceration   The results of significant diagnostics from this hospitalization (including imaging, microbiology, ancillary and laboratory) are listed below for reference.     Procedures and Diagnostic Studies:   Dg Chest 2 View  03/26/2015  CLINICAL DATA:   Recent fall while using walker with left-sided chest pain EXAM: CHEST - 2 VIEW COMPARISON:  05/17/2014 FINDINGS: Cardiac shadow is stable. Tortuous thoracic aorta is again noted with mild calcifications. Lungs are clear bilaterally. Healed left rib fractures are again noted and stable. No acute bony abnormality is seen. IMPRESSION: No acute abnormality noted. Electronically Signed   By: Inez Catalina M.D.   On: 03/26/2015 21:08   Dg Lumbar Spine Complete  03/26/2015  CLINICAL DATA:  Fall while using walker with left-sided back pain, initial encounter EXAM: LUMBAR SPINE - COMPLETE 4+ VIEW COMPARISON:  02/11/2015 FINDINGS: Five lumbar type vertebral bodies are well visualized. Facet hypertrophic changes are noted. No definitive compression deformity is noted. Mild osteophytic changes are seen. Aortic calcifications are noted as well. IMPRESSION: Mild degenerative change without acute abnormality. Electronically Signed   By: Inez Catalina M.D.   On: 03/26/2015 21:09   Abd 1 View (kub)  04/02/2015  CLINICAL DATA:  Abdominal pain EXAM: ABDOMEN - 1 VIEW COMPARISON:  None. FINDINGS: Large stool burden throughout the colon. Nonobstructive bowel gas pattern. No organomegaly or free air. Degenerative changes in the lumbar spine and hips. Hardware noted in the right hip. IMPRESSION: Large stool burden.  No obstruction or free air. Electronically Signed   By: Rolm Baptise M.D.   On: 04/02/2015 09:53   Ct Lumbar Spine Wo Contrast  04/02/2015  CLINICAL DATA:  Complains of low back pain, question fracture EXAM: CT LUMBAR SPINE WITHOUT CONTRAST TECHNIQUE: Multidetector CT imaging of the lumbar spine was performed without intravenous contrast administration. Multiplanar CT image reconstructions were also generated. COMPARISON:  Plain films 03/26/2015 FINDINGS: Mild compression fracture through the L3 vertebral body. Slight retropulsed fracture fragment along the inferior aspect of the L3 vertebral body of 2-3 mm. No  additional acute bony abnormality. Disc spaces are maintained. Diffuse osteopenia. Normal alignment. IMPRESSION: Mild acute L3 compression fracture with slight retropulsed fracture fragment along the inferior vertebral body. Electronically Signed   By: Rolm Baptise M.D.   On: 04/02/2015 10:00   Ct Pelvis Wo Contrast  03/26/2015  CLINICAL DATA:  Patient was walking with a walker and fell on the left side to the floor. Left flank pain and back pain. EXAM: CT PELVIS WITHOUT CONTRAST TECHNIQUE: Multidetector CT imaging of the pelvis was performed following the standard protocol without intravenous contrast. COMPARISON:  CT abdomen and pelvis 02/11/2015 FINDINGS: There is diffuse bone demineralization. Postoperative changes with previous internal fixation of the right hip. Associated deformity of the right femoral neck and lesser trochanter with heterotopic ossification present. The left hip appears intact. No acute displaced fractures are identified. Pelvis and sacrum appear intact. SI joints and symphysis pubis are not displaced. Degenerative changes in the lower lumbar spine and in both hips. Soft tissue pelvis demonstrates no bladder wall thickening. Uterus is atrophic with small calcified fibroid. No pelvic mass or lymphadenopathy. Visualized colon and small bowel are not distended. Calcification of the aorta iliac vessels. Intramuscular lipoma in the right anterior hip and right posterior gluteal region. IMPRESSION: Diffuse bone demineralization. Previous internal fixation of the right hip with old fracture deformity. No acute fracture or dislocation in the left hip.  Electronically Signed   By: Lucienne Capers M.D.   On: 03/26/2015 23:04   Dg Shoulder Left  03/26/2015  CLINICAL DATA:  81 year old female fell from standing today. Pain. Initial encounter. EXAM: LEFT SHOULDER - 2+ VIEW COMPARISON:  None. FINDINGS: Superior positioning of the left humeral head but no glenohumeral joint dislocation. Calcified  loose bodies or dystrophic calcification about the left glenohumeral joint. Proximal left humerus and scapula appear to remain intact. Left clavicle intact. Chronic left sixth through eighth rib fractures. Negative visualized lung parenchyma. IMPRESSION: No acute fracture or dislocation identified about the left shoulder. Electronically Signed   By: Genevie Ann M.D.   On: 03/26/2015 21:06   Dg Hip Unilat With Pelvis 2-3 Views Left  03/26/2015  CLINICAL DATA:  81 year old female fell from standing today. Pain. Initial encounter. EXAM: DG HIP (WITH OR WITHOUT PELVIS) 2-3V LEFT COMPARISON:  CT Abdomen and Pelvis 02/11/2015. FINDINGS: Partially visible right femur ORIF hardware. Femoral heads remain normally located. Pelvis appears stable and intact. Osteopenia. Proximal left femur appears intact. Visible right femur appear stable. IMPRESSION: No acute fracture or dislocation identified about the left hip or pelvis. Electronically Signed   By: Genevie Ann M.D.   On: 03/26/2015 21:08     Labs:   Basic Metabolic Panel:  Recent Labs Lab 04/01/15 2252 04/02/15 0500 04/03/15 0430  NA 139 140 139  K 4.8 3.8 3.7  CL 108 108 108  CO2 25 24 25   GLUCOSE 103* 107* 106*  BUN 22* 19 15  CREATININE 1.07* 0.92 0.91  CALCIUM 9.1 8.7* 8.6*   GFR CrCl cannot be calculated (Unknown ideal weight.). Liver Function Tests:  Recent Labs Lab 04/01/15 2252  AST 26  ALT 13*  ALKPHOS 50  BILITOT 0.7  PROT 6.7  ALBUMIN 3.1*    Recent Labs Lab 04/01/15 2252  LIPASE 50   Coagulation profile  Recent Labs Lab 04/02/15 0500  INR 1.12    CBC:  Recent Labs Lab 04/02/15 0001 04/02/15 0500 04/03/15 0430  WBC 8.8 7.6 6.5  NEUTROABS 6.3  --   --   HGB 11.2* 11.5* 10.7*  HCT 33.1* 34.6* 32.5*  MCV 96.8 97.2 98.2  PLT 177 187 187   Microbiology Recent Results (from the past 240 hour(s))  Urine culture     Status: None   Collection Time: 04/02/15 12:42 AM  Result Value Ref Range Status   Specimen  Description URINE, RANDOM  Final   Special Requests NONE  Final   Culture   Final    >=100,000 COLONIES/mL KLEBSIELLA PNEUMONIAE 50,000 COLONIES/mL GROUP B STREP(S.AGALACTIAE)ISOLATED TESTING AGAINST S. AGALACTIAE NOT ROUTINELY PERFORMED DUE TO PREDICTABILITY OF AMP/PEN/VAN SUSCEPTIBILITY. Performed at [facility]    Report Status 04/04/2015 FINAL  Final   Organism ID, Bacteria KLEBSIELLA PNEUMONIAE  Final      Susceptibility   Klebsiella pneumoniae - MIC*    AMPICILLIN >=32 RESISTANT Resistant     CEFAZOLIN <=4 SENSITIVE Sensitive     CEFTRIAXONE <=1 SENSITIVE Sensitive     CIPROFLOXACIN <=0.25 SENSITIVE Sensitive     GENTAMICIN <=1 SENSITIVE Sensitive     IMIPENEM <=0.25 SENSITIVE Sensitive     NITROFURANTOIN 64 INTERMEDIATE Intermediate     TRIMETH/SULFA <=20 SENSITIVE Sensitive     AMPICILLIN/SULBACTAM 4 SENSITIVE Sensitive     PIP/TAZO <=4 SENSITIVE Sensitive     * >=100,000 COLONIES/mL KLEBSIELLA PNEUMONIAE  Culture, blood (routine x 2)     Status: None (Preliminary result)   Collection  Time: 04/02/15  4:50 AM  Result Value Ref Range Status   Specimen Description BLOOD RIGHT ANTECUBITAL  Final   Special Requests BOTTLES DRAWN AEROBIC AND ANAEROBIC 5ML  Final   Culture   Final    NO GROWTH 2 DAYS Performed at [facility]    Report Status PENDING  Incomplete  Culture, blood (routine x 2)     Status: None (Preliminary result)   Collection Time: 04/02/15  5:00 AM  Result Value Ref Range Status   Specimen Description BLOOD RIGHT FOREARM  Final   Special Requests BOTTLES DRAWN AEROBIC AND ANAEROBIC 5ML  Final   Culture   Final    NO GROWTH 2 DAYS Performed at [facility]    Report Status PENDING  Incomplete     Discharge Instructions:   Discharge Instructions    Call MD for:  severe uncontrolled pain    Complete by:  As directed      Diet - low sodium heart healthy    Complete by:  As directed      Increase activity slowly    Complete by:   As directed   Weight bearing as tolerated.            Medication List    STOP taking these medications        cephALEXin 500 MG capsule  Commonly known as:  KEFLEX     HYDROcodone-acetaminophen 5-325 MG tablet  Commonly known as:  NORCO/VICODIN     ondansetron 4 MG tablet  Commonly known as:  ZOFRAN     saccharomyces boulardii 250 MG capsule  Commonly known as:  FLORASTOR      TAKE these medications        aspirin EC 81 MG tablet  Take 81 mg by mouth daily.     levothyroxine 25 MCG tablet  Commonly known as:  SYNTHROID, LEVOTHROID  Take 25 mcg by mouth daily before breakfast.     losartan 50 MG tablet  Commonly known as:  COZAAR  Take 50 mg by mouth every morning.     LUMIGAN 0.01 % Soln  Generic drug:  bimatoprost  Place 1 drop into the left eye at bedtime.     nitrofurantoin 50 MG capsule  Commonly known as:  MACRODANTIN  Take 50 mg by mouth at bedtime.     oxyCODONE-acetaminophen 5-325 MG tablet  Commonly known as:  PERCOCET/ROXICET  Take 1 tablet by mouth 3 (three) times daily as needed for moderate pain or severe pain.     pantoprazole 40 MG tablet  Commonly known as:  PROTONIX  Take 40 mg by mouth daily.     polyethylene glycol packet  Commonly known as:  MIRALAX / GLYCOLAX  Take 17 g by mouth daily.     senna-docusate 8.6-50 MG tablet  Commonly known as:  Senokot-S  Take 2 tablets by mouth 2 (two) times daily.     solifenacin 10 MG tablet  Commonly known as:  VESICARE  Take 10 mg by mouth daily.     topiramate 25 MG tablet  Commonly known as:  TOPAMAX  Take 25 mg by mouth every evening.     Vitamin D3 2000 UNITS capsule  Take 2,000 Units by mouth every morning.           Follow-up Information    Schedule an appointment as soon as possible for a visit with Tobi Bastos, MD.   Specialty:  Orthopedic Surgery   Why:  If  symptoms worsen   Contact information:   [address] [place] [address] [phone]         Schedule an appointment as soon as possible for a visit with GATES,ROBERT NEVILL, MD.   Specialty:  Internal Medicine   Why:  As needed   Contact information:   [place] [employer] [address] [place] [place] [address] [phone]        Time coordinating discharge: 35 minutes.  Signed:  RAMA,CHRISTINA  Pager [phone] [facility] 04/06/2015, 8:30 AM

## 2015-04-06 NOTE — Discharge Instructions (Signed)

## 2015-04-07 LAB — CULTURE, BLOOD (ROUTINE X 2)
Culture: NO GROWTH
Culture: NO GROWTH

## 2015-04-08 DIAGNOSIS — R531 Weakness: Secondary | ICD-10-CM | POA: Insufficient documentation

## 2015-04-08 DIAGNOSIS — Z66 Do not resuscitate: Secondary | ICD-10-CM | POA: Insufficient documentation

## 2015-04-08 DIAGNOSIS — Z515 Encounter for palliative care: Secondary | ICD-10-CM | POA: Insufficient documentation

## 2015-04-09 DIAGNOSIS — L98499 Non-pressure chronic ulcer of skin of other sites with unspecified severity: Secondary | ICD-10-CM | POA: Diagnosis not present

## 2015-04-09 DIAGNOSIS — E039 Hypothyroidism, unspecified: Secondary | ICD-10-CM | POA: Diagnosis not present

## 2015-04-09 DIAGNOSIS — L89151 Pressure ulcer of sacral region, stage 1: Secondary | ICD-10-CM | POA: Diagnosis not present

## 2015-04-09 DIAGNOSIS — K5900 Constipation, unspecified: Secondary | ICD-10-CM | POA: Diagnosis not present

## 2015-04-09 DIAGNOSIS — T148 Other injury of unspecified body region: Secondary | ICD-10-CM | POA: Diagnosis not present

## 2015-04-09 DIAGNOSIS — N390 Urinary tract infection, site not specified: Secondary | ICD-10-CM | POA: Diagnosis not present

## 2015-04-15 DIAGNOSIS — M79604 Pain in right leg: Secondary | ICD-10-CM | POA: Diagnosis not present

## 2015-04-15 DIAGNOSIS — M4807 Spinal stenosis, lumbosacral region: Secondary | ICD-10-CM | POA: Diagnosis not present

## 2015-04-15 DIAGNOSIS — M79605 Pain in left leg: Secondary | ICD-10-CM | POA: Diagnosis not present

## 2015-04-16 DIAGNOSIS — L89151 Pressure ulcer of sacral region, stage 1: Secondary | ICD-10-CM | POA: Diagnosis not present

## 2015-05-05 DIAGNOSIS — N390 Urinary tract infection, site not specified: Secondary | ICD-10-CM | POA: Diagnosis not present

## 2015-05-30 DIAGNOSIS — I10 Essential (primary) hypertension: Secondary | ICD-10-CM | POA: Diagnosis not present

## 2015-05-30 DIAGNOSIS — R5381 Other malaise: Secondary | ICD-10-CM | POA: Diagnosis not present

## 2015-05-30 DIAGNOSIS — N390 Urinary tract infection, site not specified: Secondary | ICD-10-CM | POA: Diagnosis not present

## 2015-08-01 DIAGNOSIS — R410 Disorientation, unspecified: Secondary | ICD-10-CM | POA: Diagnosis not present

## 2015-08-02 DIAGNOSIS — N390 Urinary tract infection, site not specified: Secondary | ICD-10-CM | POA: Diagnosis not present

## 2015-08-11 DIAGNOSIS — N390 Urinary tract infection, site not specified: Secondary | ICD-10-CM | POA: Diagnosis not present

## 2015-08-21 DIAGNOSIS — N390 Urinary tract infection, site not specified: Secondary | ICD-10-CM | POA: Diagnosis not present

## 2015-08-25 DIAGNOSIS — N390 Urinary tract infection, site not specified: Secondary | ICD-10-CM | POA: Diagnosis not present

## 2015-09-08 DIAGNOSIS — N390 Urinary tract infection, site not specified: Secondary | ICD-10-CM | POA: Diagnosis not present

## 2015-09-10 DIAGNOSIS — N390 Urinary tract infection, site not specified: Secondary | ICD-10-CM | POA: Diagnosis not present

## 2015-09-10 DIAGNOSIS — R410 Disorientation, unspecified: Secondary | ICD-10-CM | POA: Diagnosis not present

## 2015-09-12 DIAGNOSIS — N390 Urinary tract infection, site not specified: Secondary | ICD-10-CM | POA: Diagnosis not present

## 2015-09-18 DIAGNOSIS — I739 Peripheral vascular disease, unspecified: Secondary | ICD-10-CM | POA: Diagnosis not present

## 2015-09-18 DIAGNOSIS — M79672 Pain in left foot: Secondary | ICD-10-CM | POA: Diagnosis not present

## 2015-09-18 DIAGNOSIS — B351 Tinea unguium: Secondary | ICD-10-CM | POA: Diagnosis not present

## 2015-09-18 DIAGNOSIS — M79671 Pain in right foot: Secondary | ICD-10-CM | POA: Diagnosis not present

## 2015-09-26 DIAGNOSIS — N390 Urinary tract infection, site not specified: Secondary | ICD-10-CM | POA: Diagnosis not present

## 2015-09-27 DIAGNOSIS — E039 Hypothyroidism, unspecified: Secondary | ICD-10-CM | POA: Diagnosis not present

## 2015-09-27 DIAGNOSIS — N390 Urinary tract infection, site not specified: Secondary | ICD-10-CM | POA: Diagnosis not present

## 2015-09-27 DIAGNOSIS — I10 Essential (primary) hypertension: Secondary | ICD-10-CM | POA: Diagnosis not present

## 2015-10-03 DIAGNOSIS — N390 Urinary tract infection, site not specified: Secondary | ICD-10-CM | POA: Diagnosis not present

## 2015-10-08 DIAGNOSIS — N390 Urinary tract infection, site not specified: Secondary | ICD-10-CM | POA: Diagnosis not present

## 2015-10-08 DIAGNOSIS — R262 Difficulty in walking, not elsewhere classified: Secondary | ICD-10-CM | POA: Diagnosis not present

## 2015-10-08 DIAGNOSIS — R278 Other lack of coordination: Secondary | ICD-10-CM | POA: Diagnosis not present

## 2015-10-08 DIAGNOSIS — M6281 Muscle weakness (generalized): Secondary | ICD-10-CM | POA: Diagnosis not present

## 2015-10-09 DIAGNOSIS — R262 Difficulty in walking, not elsewhere classified: Secondary | ICD-10-CM | POA: Diagnosis not present

## 2015-10-09 DIAGNOSIS — R278 Other lack of coordination: Secondary | ICD-10-CM | POA: Diagnosis not present

## 2015-10-09 DIAGNOSIS — M6281 Muscle weakness (generalized): Secondary | ICD-10-CM | POA: Diagnosis not present

## 2015-10-10 DIAGNOSIS — M6281 Muscle weakness (generalized): Secondary | ICD-10-CM | POA: Diagnosis not present

## 2015-10-10 DIAGNOSIS — R262 Difficulty in walking, not elsewhere classified: Secondary | ICD-10-CM | POA: Diagnosis not present

## 2015-10-10 DIAGNOSIS — R278 Other lack of coordination: Secondary | ICD-10-CM | POA: Diagnosis not present

## 2015-10-11 DIAGNOSIS — R278 Other lack of coordination: Secondary | ICD-10-CM | POA: Diagnosis not present

## 2015-10-11 DIAGNOSIS — R262 Difficulty in walking, not elsewhere classified: Secondary | ICD-10-CM | POA: Diagnosis not present

## 2015-10-11 DIAGNOSIS — Z79899 Other long term (current) drug therapy: Secondary | ICD-10-CM | POA: Diagnosis not present

## 2015-10-11 DIAGNOSIS — M6281 Muscle weakness (generalized): Secondary | ICD-10-CM | POA: Diagnosis not present

## 2015-10-13 DIAGNOSIS — R262 Difficulty in walking, not elsewhere classified: Secondary | ICD-10-CM | POA: Diagnosis not present

## 2015-10-13 DIAGNOSIS — R278 Other lack of coordination: Secondary | ICD-10-CM | POA: Diagnosis not present

## 2015-10-13 DIAGNOSIS — M6281 Muscle weakness (generalized): Secondary | ICD-10-CM | POA: Diagnosis not present

## 2015-10-14 DIAGNOSIS — R278 Other lack of coordination: Secondary | ICD-10-CM | POA: Diagnosis not present

## 2015-10-14 DIAGNOSIS — R296 Repeated falls: Secondary | ICD-10-CM | POA: Diagnosis not present

## 2015-10-14 DIAGNOSIS — R262 Difficulty in walking, not elsewhere classified: Secondary | ICD-10-CM | POA: Diagnosis not present

## 2015-10-15 DIAGNOSIS — R296 Repeated falls: Secondary | ICD-10-CM | POA: Diagnosis not present

## 2015-10-15 DIAGNOSIS — R262 Difficulty in walking, not elsewhere classified: Secondary | ICD-10-CM | POA: Diagnosis not present

## 2015-10-15 DIAGNOSIS — R278 Other lack of coordination: Secondary | ICD-10-CM | POA: Diagnosis not present

## 2015-10-16 DIAGNOSIS — R278 Other lack of coordination: Secondary | ICD-10-CM | POA: Diagnosis not present

## 2015-10-16 DIAGNOSIS — R262 Difficulty in walking, not elsewhere classified: Secondary | ICD-10-CM | POA: Diagnosis not present

## 2015-10-16 DIAGNOSIS — R296 Repeated falls: Secondary | ICD-10-CM | POA: Diagnosis not present

## 2015-10-17 DIAGNOSIS — R296 Repeated falls: Secondary | ICD-10-CM | POA: Diagnosis not present

## 2015-10-17 DIAGNOSIS — R278 Other lack of coordination: Secondary | ICD-10-CM | POA: Diagnosis not present

## 2015-10-17 DIAGNOSIS — R262 Difficulty in walking, not elsewhere classified: Secondary | ICD-10-CM | POA: Diagnosis not present

## 2015-10-18 DIAGNOSIS — R278 Other lack of coordination: Secondary | ICD-10-CM | POA: Diagnosis not present

## 2015-10-18 DIAGNOSIS — R296 Repeated falls: Secondary | ICD-10-CM | POA: Diagnosis not present

## 2015-10-18 DIAGNOSIS — R262 Difficulty in walking, not elsewhere classified: Secondary | ICD-10-CM | POA: Diagnosis not present

## 2015-10-21 DIAGNOSIS — R262 Difficulty in walking, not elsewhere classified: Secondary | ICD-10-CM | POA: Diagnosis not present

## 2015-10-21 DIAGNOSIS — R296 Repeated falls: Secondary | ICD-10-CM | POA: Diagnosis not present

## 2015-10-21 DIAGNOSIS — R278 Other lack of coordination: Secondary | ICD-10-CM | POA: Diagnosis not present

## 2015-10-22 DIAGNOSIS — R278 Other lack of coordination: Secondary | ICD-10-CM | POA: Diagnosis not present

## 2015-10-22 DIAGNOSIS — R296 Repeated falls: Secondary | ICD-10-CM | POA: Diagnosis not present

## 2015-10-22 DIAGNOSIS — R262 Difficulty in walking, not elsewhere classified: Secondary | ICD-10-CM | POA: Diagnosis not present

## 2015-10-23 DIAGNOSIS — R278 Other lack of coordination: Secondary | ICD-10-CM | POA: Diagnosis not present

## 2015-10-23 DIAGNOSIS — R262 Difficulty in walking, not elsewhere classified: Secondary | ICD-10-CM | POA: Diagnosis not present

## 2015-10-23 DIAGNOSIS — R296 Repeated falls: Secondary | ICD-10-CM | POA: Diagnosis not present

## 2015-10-24 DIAGNOSIS — R262 Difficulty in walking, not elsewhere classified: Secondary | ICD-10-CM | POA: Diagnosis not present

## 2015-10-24 DIAGNOSIS — R296 Repeated falls: Secondary | ICD-10-CM | POA: Diagnosis not present

## 2015-10-24 DIAGNOSIS — R278 Other lack of coordination: Secondary | ICD-10-CM | POA: Diagnosis not present

## 2015-10-25 DIAGNOSIS — R296 Repeated falls: Secondary | ICD-10-CM | POA: Diagnosis not present

## 2015-10-25 DIAGNOSIS — R262 Difficulty in walking, not elsewhere classified: Secondary | ICD-10-CM | POA: Diagnosis not present

## 2015-10-25 DIAGNOSIS — R278 Other lack of coordination: Secondary | ICD-10-CM | POA: Diagnosis not present

## 2015-10-27 IMAGING — CR DG SHOULDER 2+V*L*
2 series · 2 of 2 positions shown · non-contrast
Comparison: None.

CLINICAL DATA: [AGE] female fell from standing today. Pain.
Initial encounter.

EXAM:
LEFT SHOULDER - 2+ VIEW

[t shoulder y-view left]
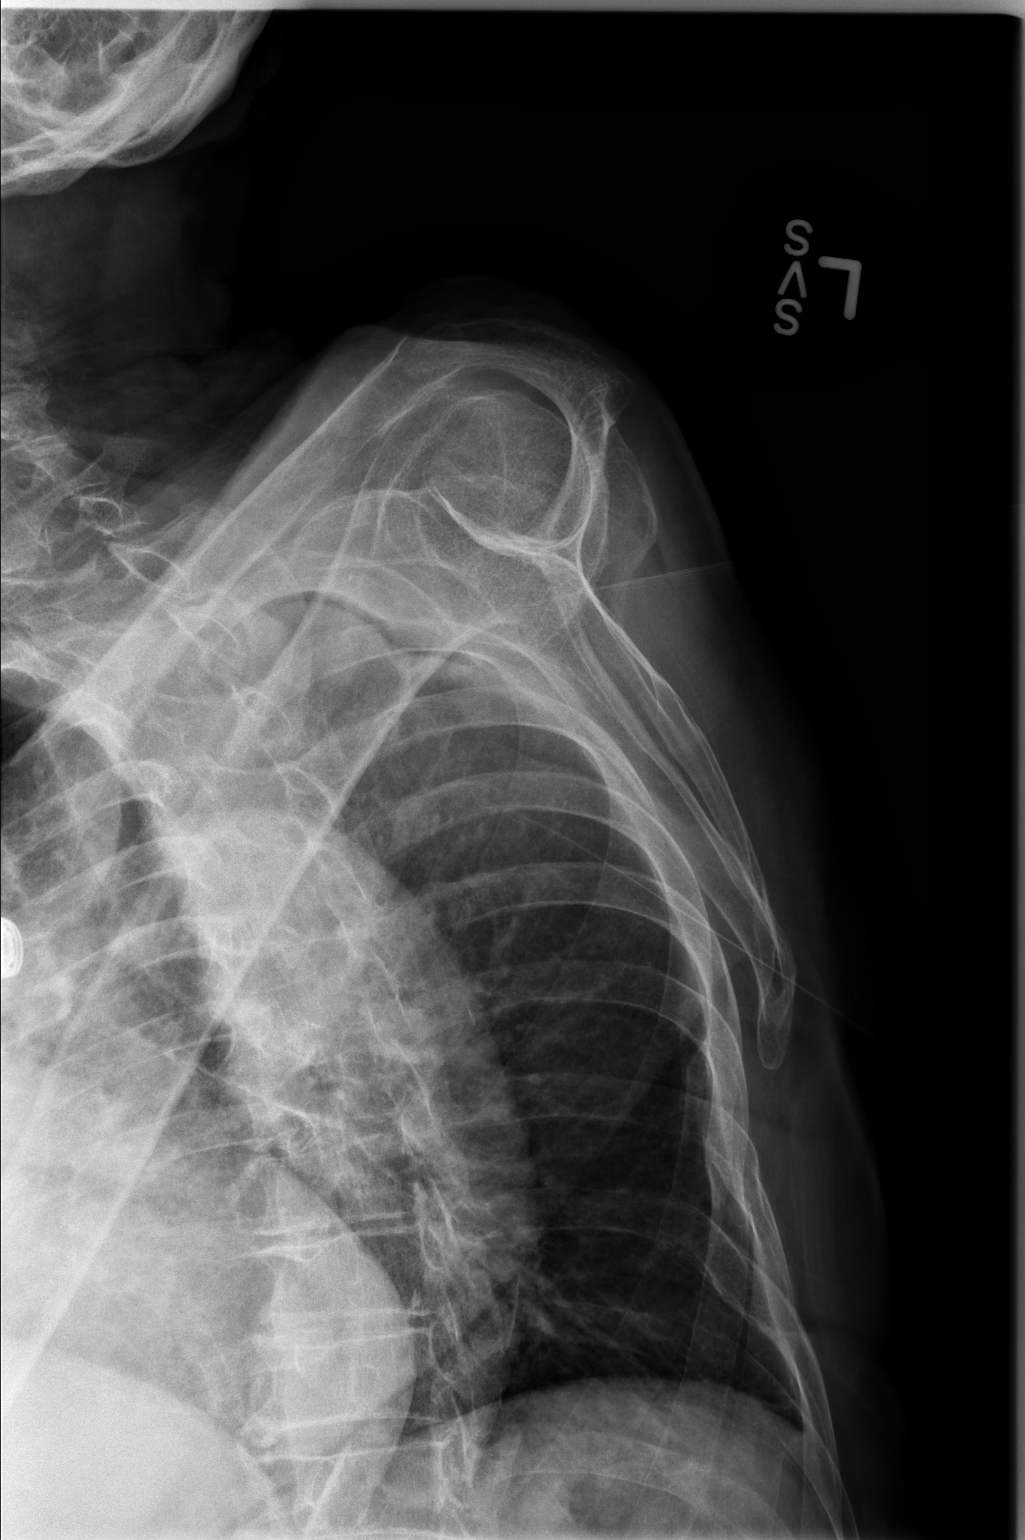

[t shoulder external left]
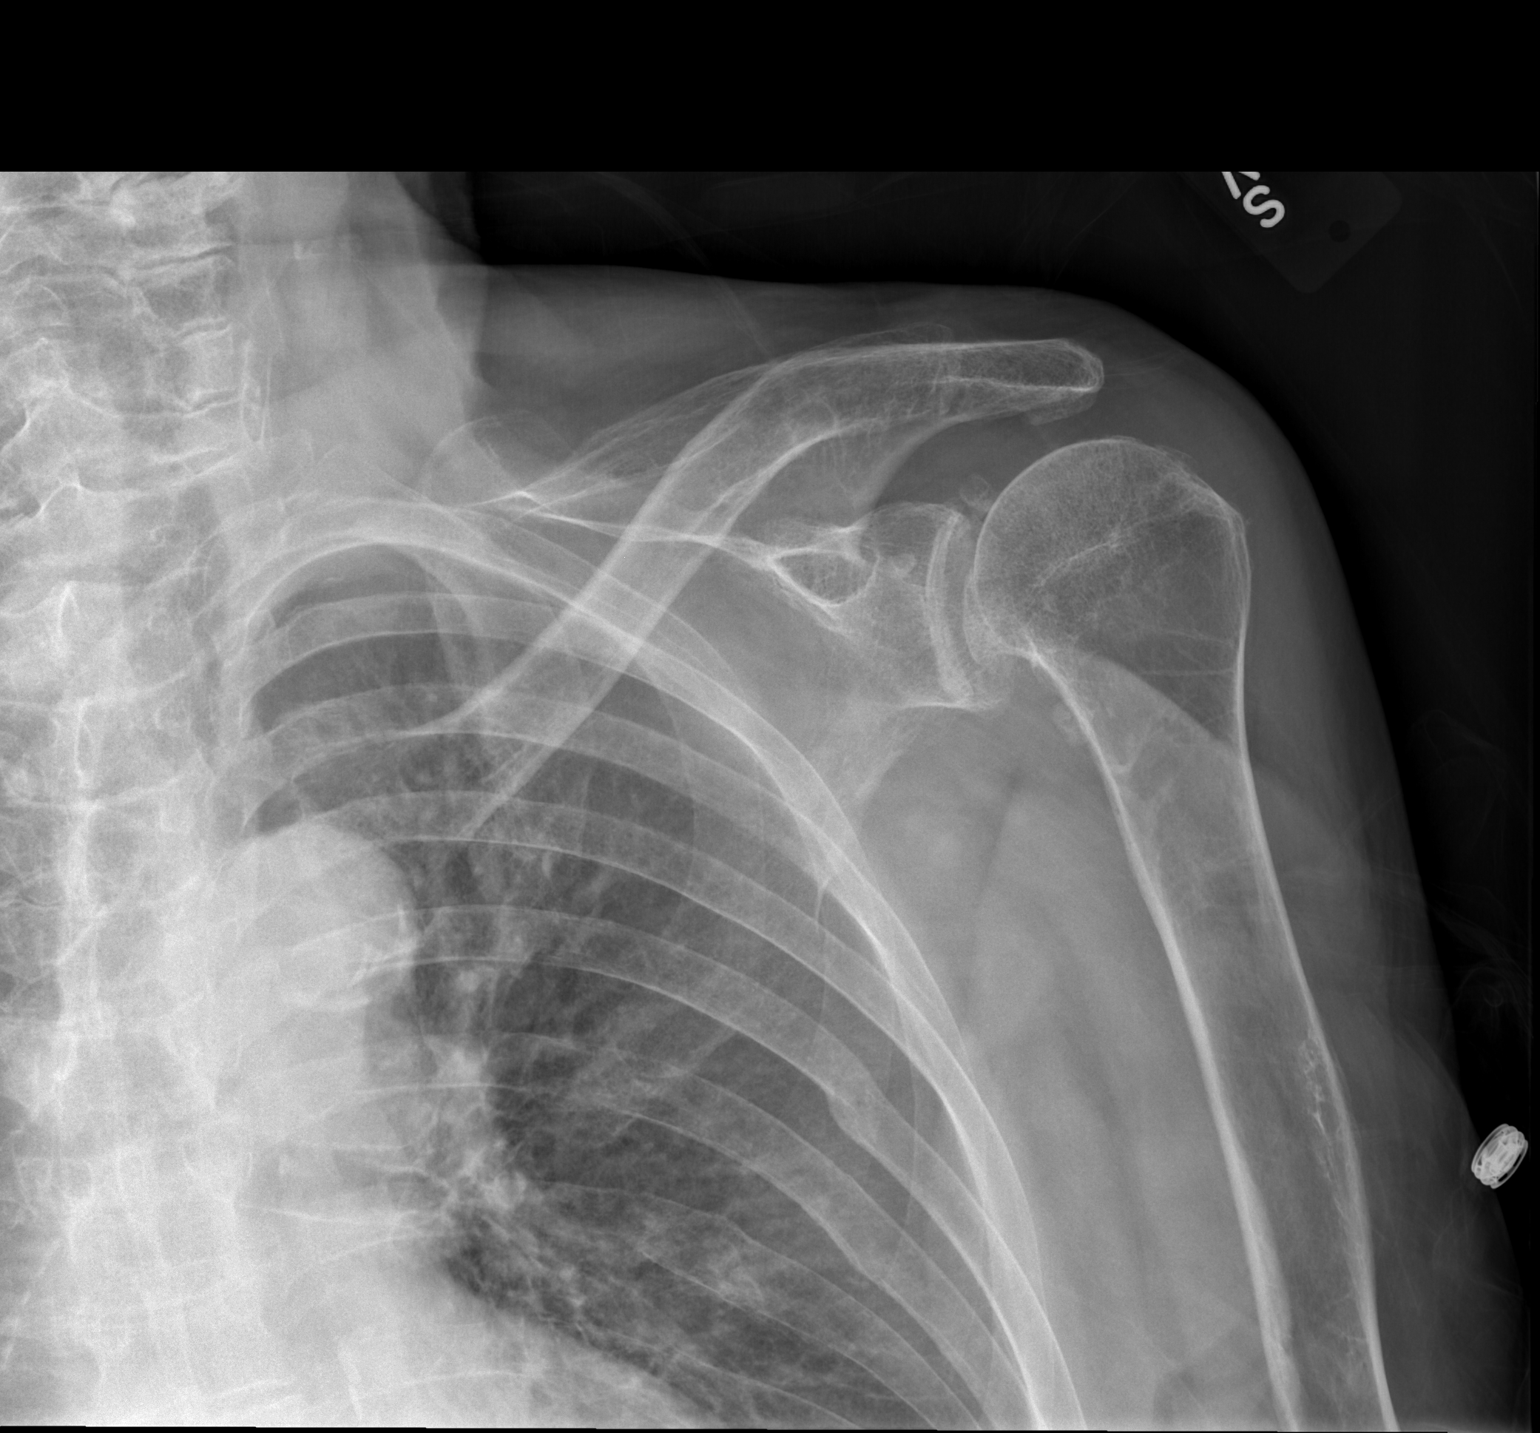

[2 of 2 positions shown; findings below may reference images not displayed]

FINDINGS: Superior positioning of the left humeral head but no glenohumeral
joint dislocation. Calcified loose bodies or dystrophic
calcification about the left glenohumeral joint. Proximal left
humerus and scapula appear to remain intact. Left clavicle intact.
Chronic left sixth through eighth rib fractures. Negative visualized
lung parenchyma.
IMPRESSION: No acute fracture or dislocation identified about the left shoulder.

## 2015-10-27 IMAGING — CR DG HIP (WITH OR WITHOUT PELVIS) 2-3V*L*
4 series · 4 of 4 positions shown · non-contrast
Comparison: CT Abdomen and Pelvis 02/11/2015.

CLINICAL DATA: [AGE] female fell from standing today. Pain.
Initial encounter.

EXAM:
DG HIP (WITH OR WITHOUT PELVIS) 2-3V LEFT

[t pelvis ap (1 of 2)]
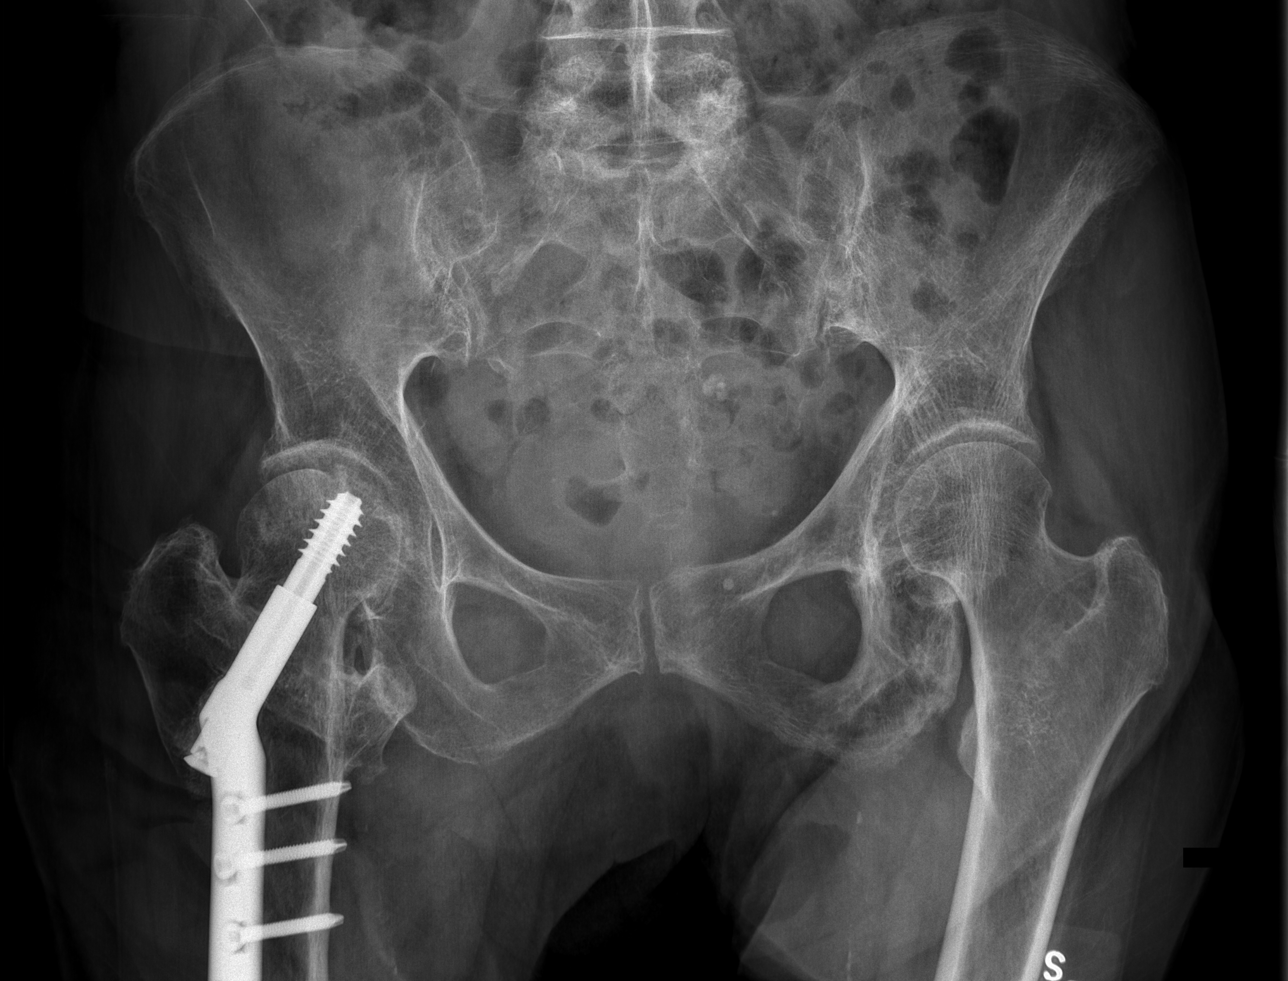

[t pelvis ap (2 of 2)]
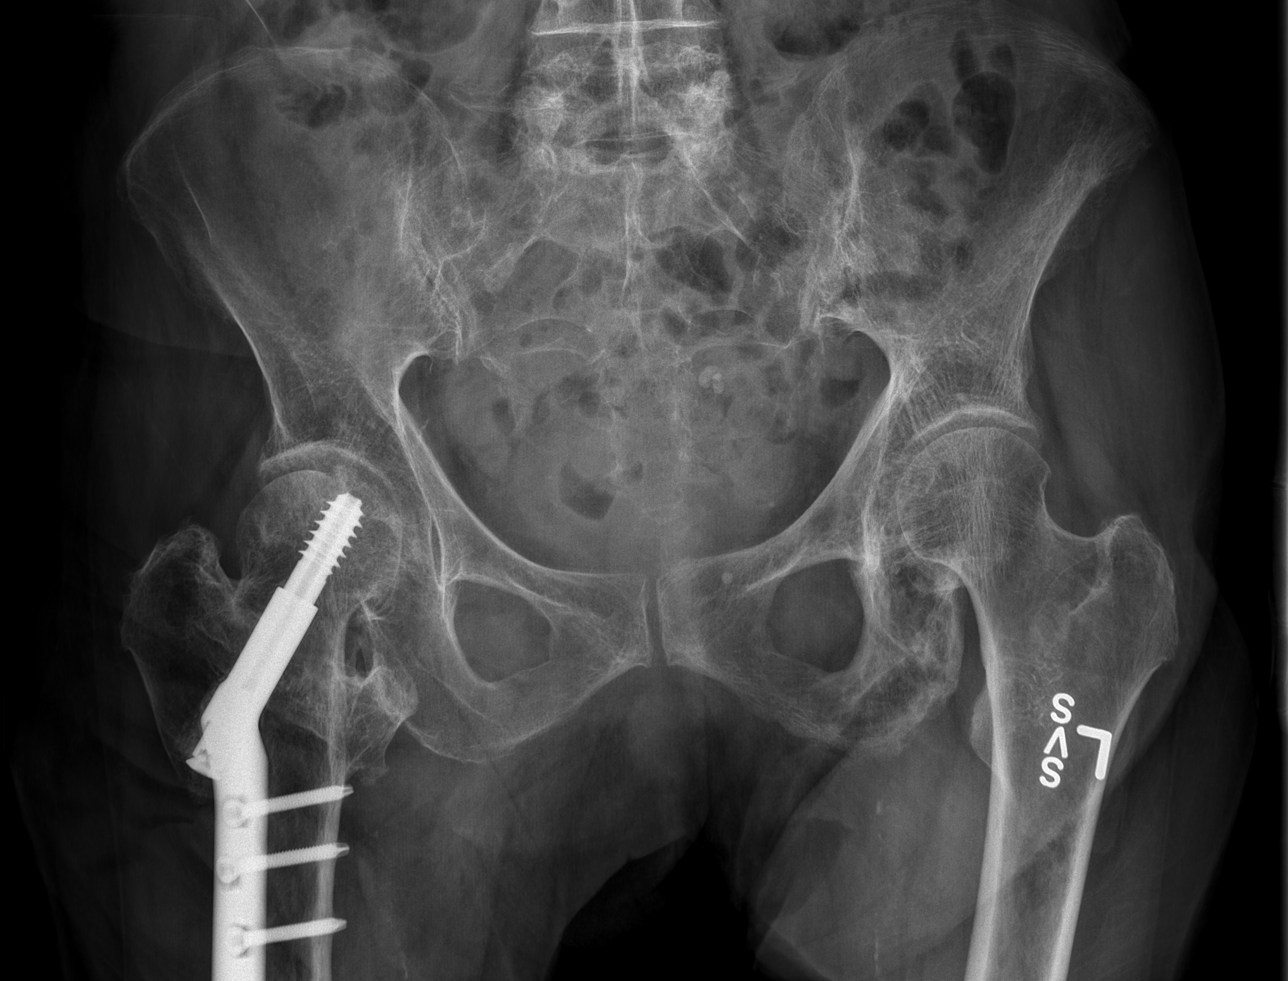

[t hip ap left]
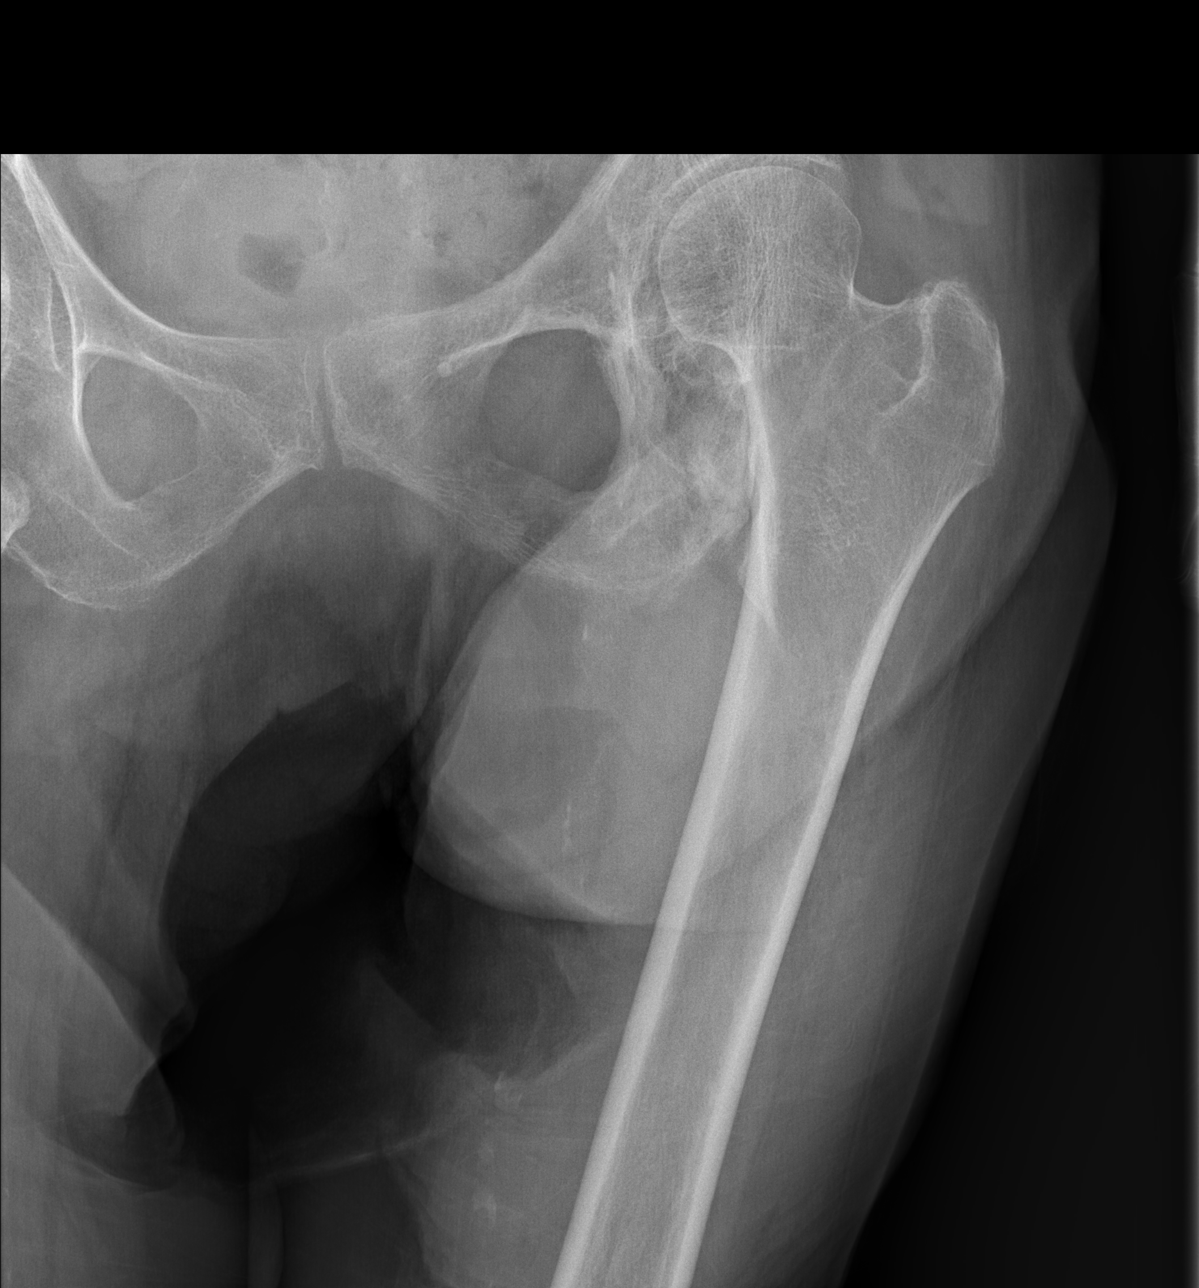

[t hip frog leg left]
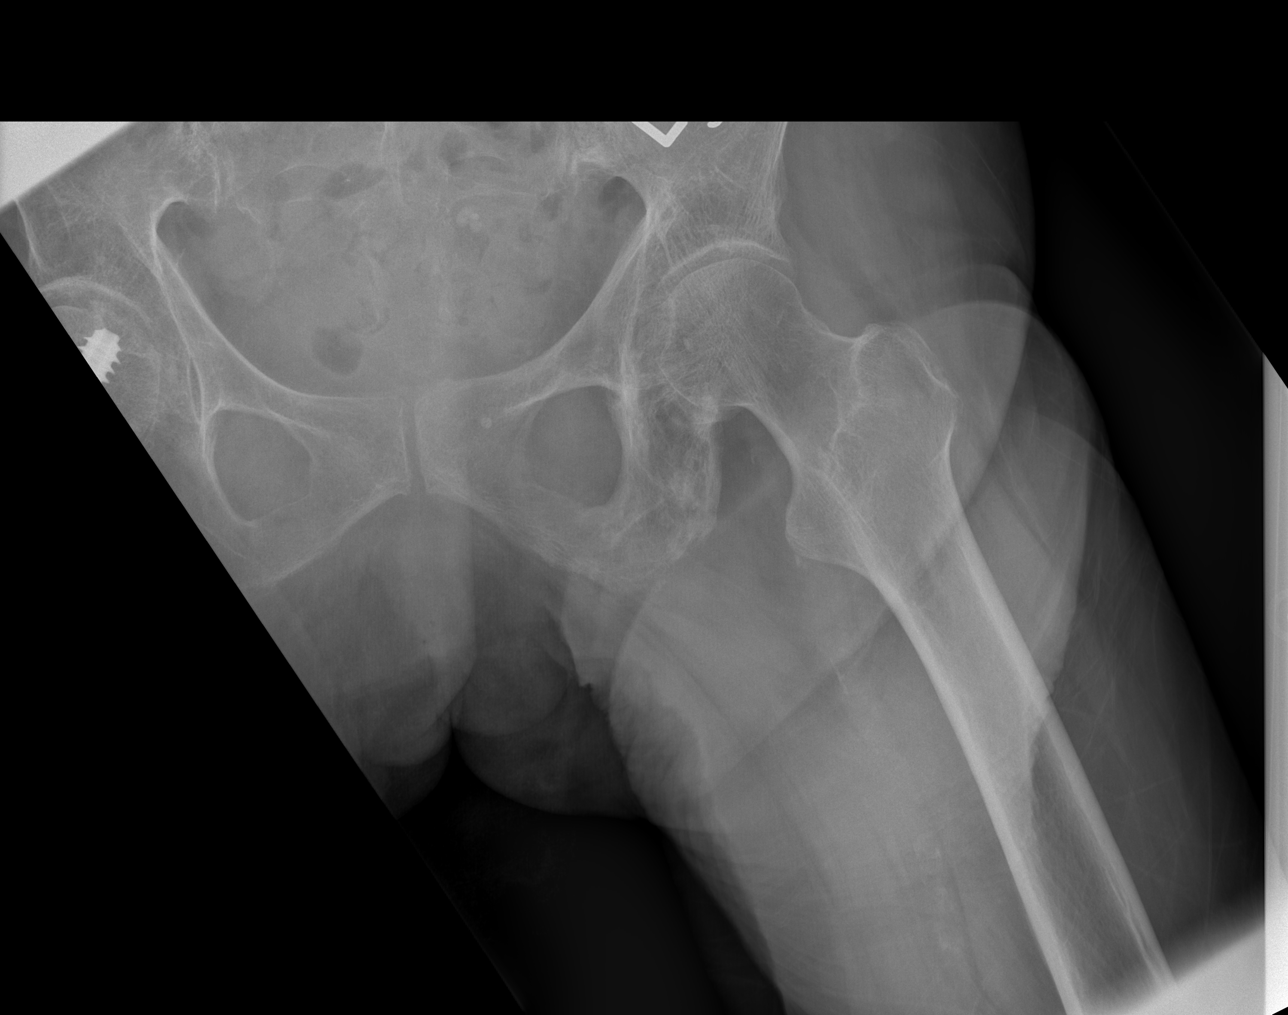

[4 of 4 positions shown; findings below may reference images not displayed]

FINDINGS: Partially visible right femur ORIF hardware. Femoral heads remain
normally located. Pelvis appears stable and intact. Osteopenia.
Proximal left femur appears intact. Visible right femur appear
stable.
IMPRESSION: No acute fracture or dislocation identified about the left hip or
pelvis.

## 2015-10-27 IMAGING — CR DG CHEST 2V
2 series · 2 of 2 positions shown · non-contrast
Comparison: 05/17/2014

CLINICAL DATA: Recent fall while using walker with left-sided chest
pain

EXAM:
CHEST - 2 VIEW

[x chest ap]
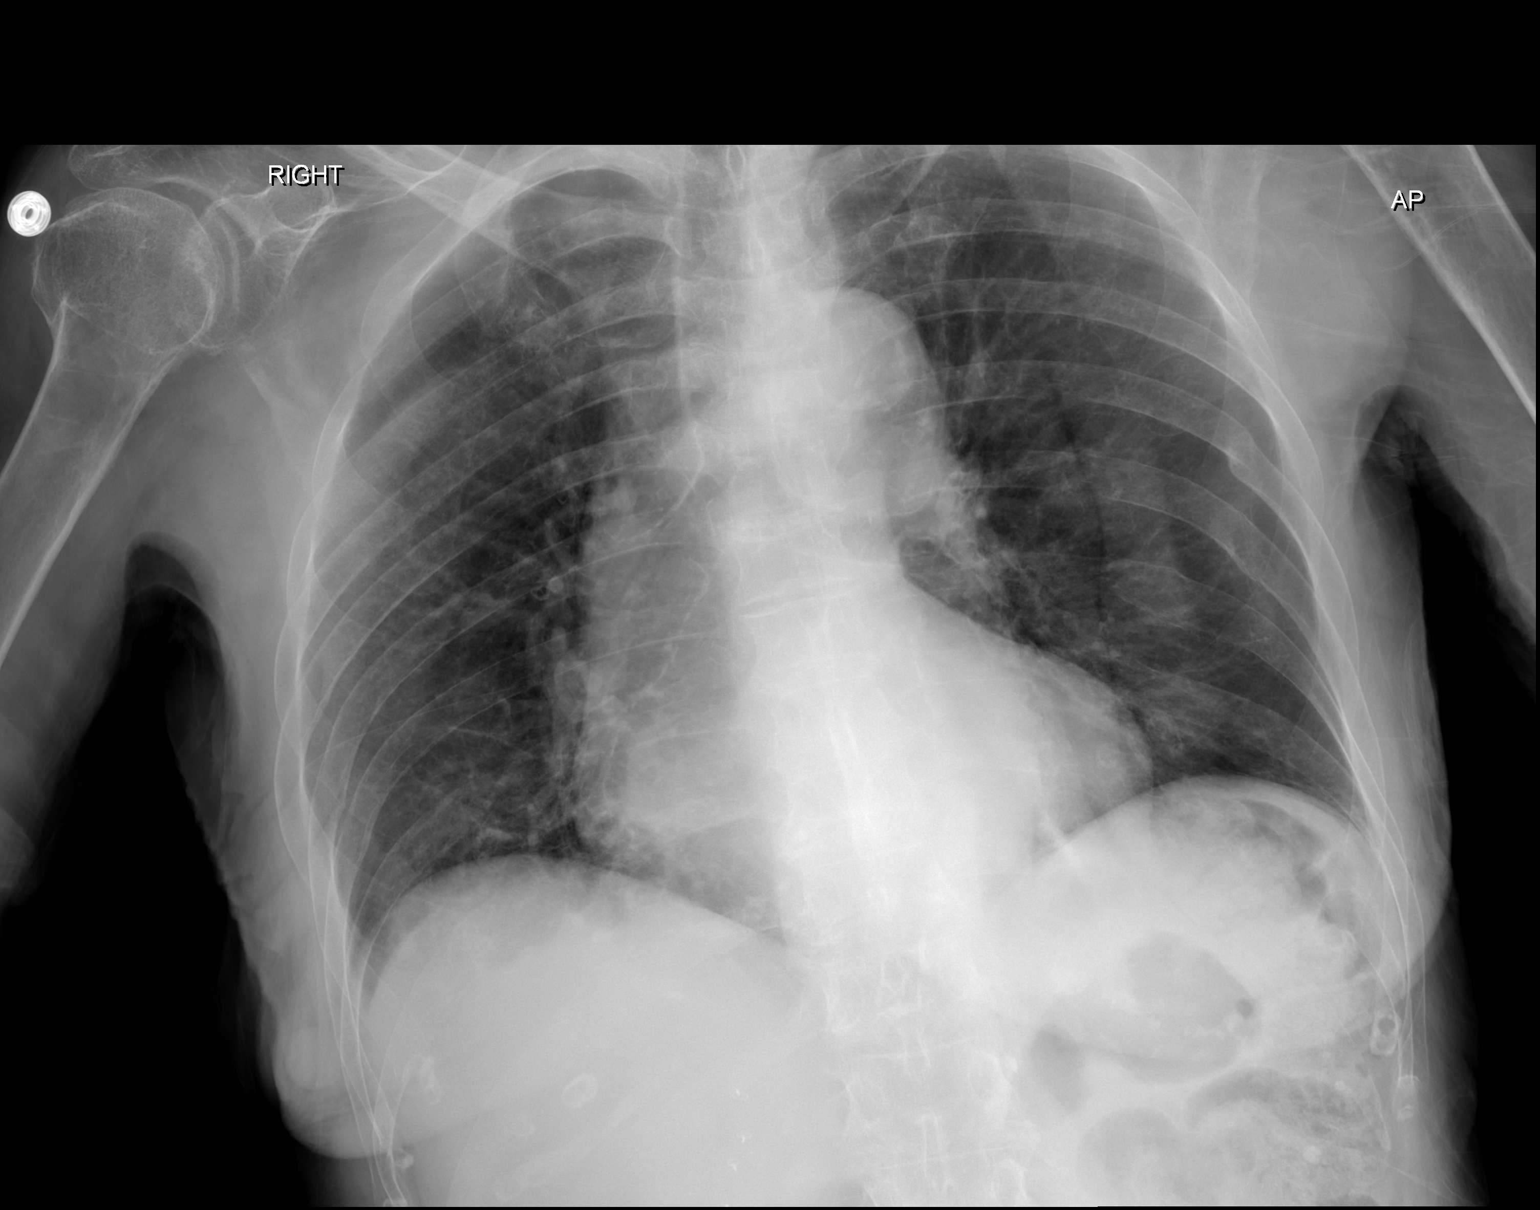

[w chest lat]
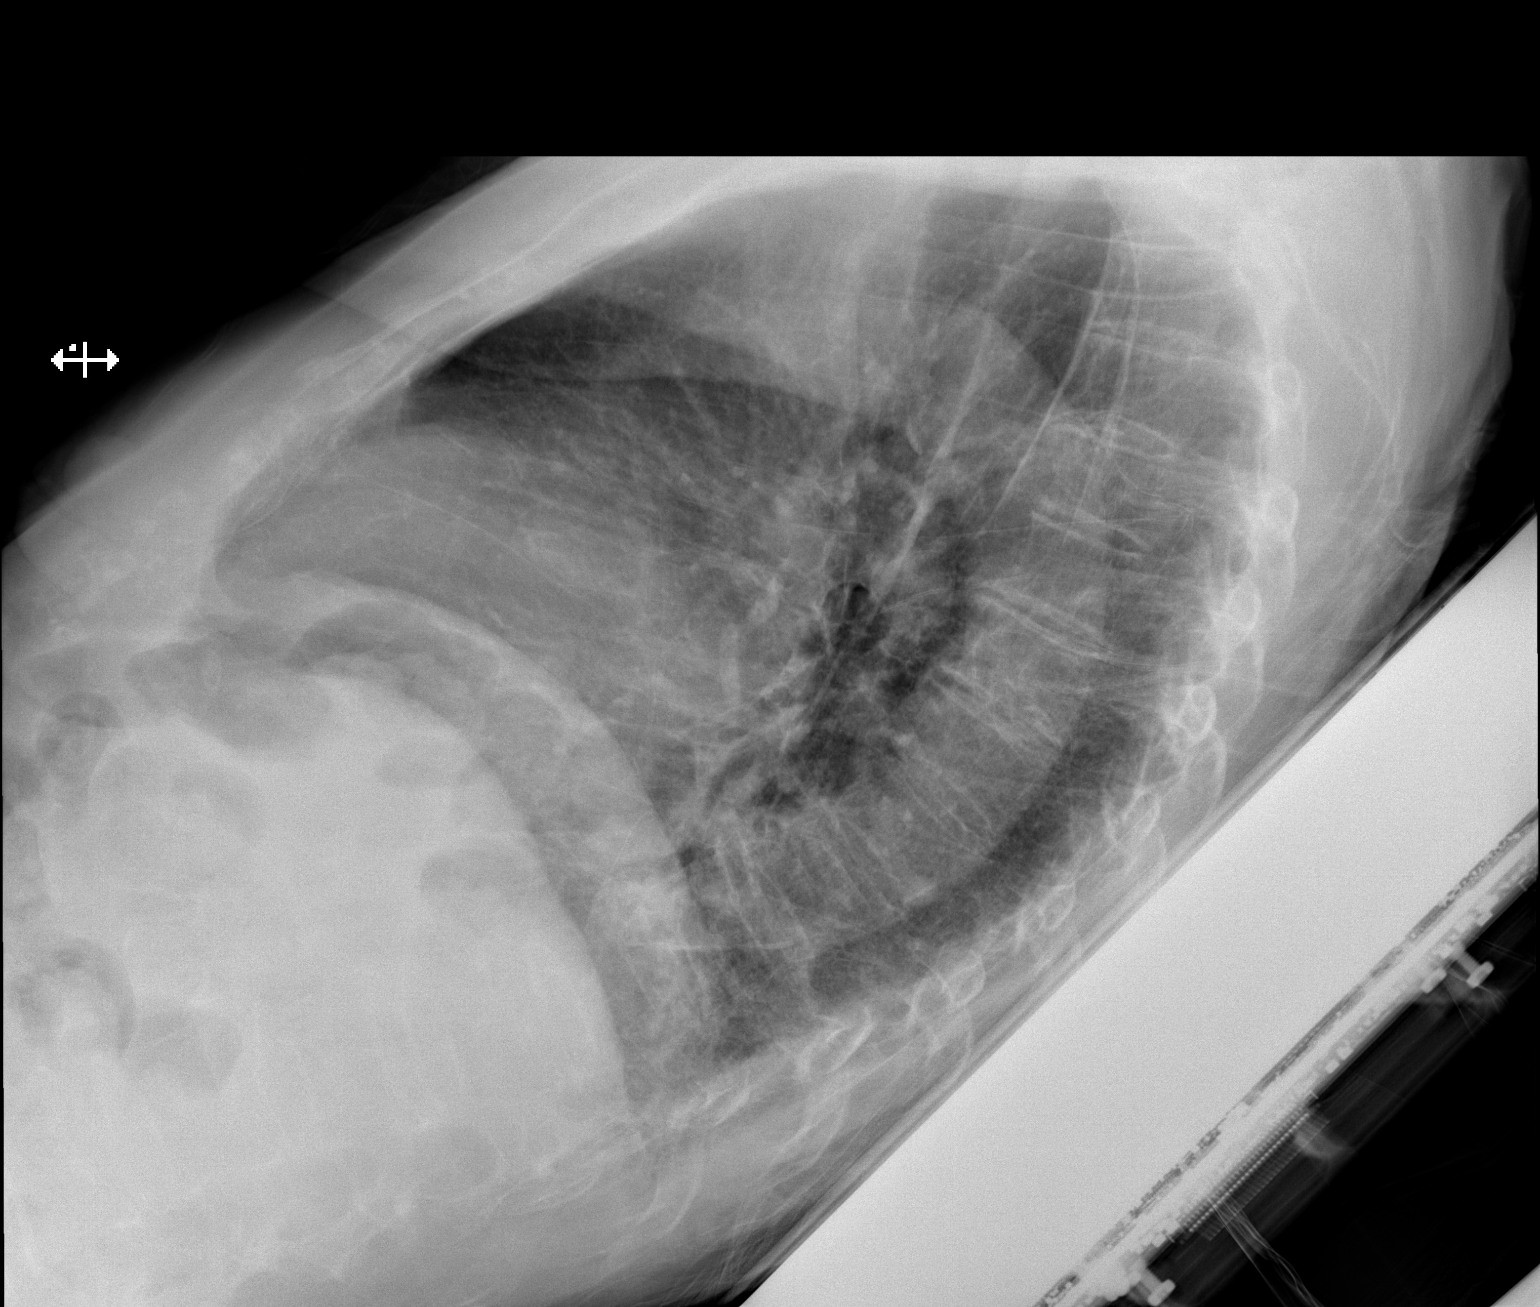

[2 of 2 positions shown; findings below may reference images not displayed]

FINDINGS: Cardiac shadow is stable. Tortuous thoracic aorta is again noted
with mild calcifications. Lungs are clear bilaterally. Healed left
rib fractures are again noted and stable. No acute bony abnormality
is seen.
IMPRESSION: No acute abnormality noted.

## 2015-10-28 DIAGNOSIS — R262 Difficulty in walking, not elsewhere classified: Secondary | ICD-10-CM | POA: Diagnosis not present

## 2015-10-28 DIAGNOSIS — R278 Other lack of coordination: Secondary | ICD-10-CM | POA: Diagnosis not present

## 2015-10-28 DIAGNOSIS — R296 Repeated falls: Secondary | ICD-10-CM | POA: Diagnosis not present

## 2015-10-29 DIAGNOSIS — R296 Repeated falls: Secondary | ICD-10-CM | POA: Diagnosis not present

## 2015-10-29 DIAGNOSIS — R262 Difficulty in walking, not elsewhere classified: Secondary | ICD-10-CM | POA: Diagnosis not present

## 2015-10-29 DIAGNOSIS — R278 Other lack of coordination: Secondary | ICD-10-CM | POA: Diagnosis not present

## 2015-10-30 DIAGNOSIS — R262 Difficulty in walking, not elsewhere classified: Secondary | ICD-10-CM | POA: Diagnosis not present

## 2015-10-30 DIAGNOSIS — R296 Repeated falls: Secondary | ICD-10-CM | POA: Diagnosis not present

## 2015-10-30 DIAGNOSIS — R278 Other lack of coordination: Secondary | ICD-10-CM | POA: Diagnosis not present

## 2015-10-31 DIAGNOSIS — R262 Difficulty in walking, not elsewhere classified: Secondary | ICD-10-CM | POA: Diagnosis not present

## 2015-10-31 DIAGNOSIS — R278 Other lack of coordination: Secondary | ICD-10-CM | POA: Diagnosis not present

## 2015-10-31 DIAGNOSIS — R296 Repeated falls: Secondary | ICD-10-CM | POA: Diagnosis not present

## 2015-11-01 DIAGNOSIS — R296 Repeated falls: Secondary | ICD-10-CM | POA: Diagnosis not present

## 2015-11-01 DIAGNOSIS — R262 Difficulty in walking, not elsewhere classified: Secondary | ICD-10-CM | POA: Diagnosis not present

## 2015-11-01 DIAGNOSIS — R278 Other lack of coordination: Secondary | ICD-10-CM | POA: Diagnosis not present

## 2015-11-04 DIAGNOSIS — R278 Other lack of coordination: Secondary | ICD-10-CM | POA: Diagnosis not present

## 2015-11-04 DIAGNOSIS — R262 Difficulty in walking, not elsewhere classified: Secondary | ICD-10-CM | POA: Diagnosis not present

## 2015-11-04 DIAGNOSIS — R296 Repeated falls: Secondary | ICD-10-CM | POA: Diagnosis not present

## 2015-11-27 DIAGNOSIS — H401221 Low-tension glaucoma, left eye, mild stage: Secondary | ICD-10-CM | POA: Diagnosis not present

## 2015-12-07 DIAGNOSIS — I10 Essential (primary) hypertension: Secondary | ICD-10-CM | POA: Diagnosis not present

## 2015-12-07 DIAGNOSIS — G43909 Migraine, unspecified, not intractable, without status migrainosus: Secondary | ICD-10-CM | POA: Diagnosis not present

## 2015-12-07 DIAGNOSIS — N3281 Overactive bladder: Secondary | ICD-10-CM | POA: Diagnosis not present

## 2015-12-07 DIAGNOSIS — N390 Urinary tract infection, site not specified: Secondary | ICD-10-CM | POA: Diagnosis not present

## 2016-01-02 DIAGNOSIS — N390 Urinary tract infection, site not specified: Secondary | ICD-10-CM | POA: Diagnosis not present

## 2016-03-04 DIAGNOSIS — N390 Urinary tract infection, site not specified: Secondary | ICD-10-CM | POA: Diagnosis not present

## 2016-03-15 DIAGNOSIS — H00016 Hordeolum externum left eye, unspecified eyelid: Secondary | ICD-10-CM | POA: Diagnosis not present

## 2016-03-15 DIAGNOSIS — N390 Urinary tract infection, site not specified: Secondary | ICD-10-CM | POA: Diagnosis not present

## 2016-03-16 DIAGNOSIS — B961 Klebsiella pneumoniae [K. pneumoniae] as the cause of diseases classified elsewhere: Secondary | ICD-10-CM | POA: Diagnosis not present

## 2016-03-16 DIAGNOSIS — N390 Urinary tract infection, site not specified: Secondary | ICD-10-CM | POA: Diagnosis not present

## 2016-03-16 DIAGNOSIS — Z23 Encounter for immunization: Secondary | ICD-10-CM | POA: Diagnosis not present

## 2016-03-16 DIAGNOSIS — K219 Gastro-esophageal reflux disease without esophagitis: Secondary | ICD-10-CM | POA: Diagnosis not present

## 2016-03-16 DIAGNOSIS — I10 Essential (primary) hypertension: Secondary | ICD-10-CM | POA: Diagnosis not present

## 2016-03-16 DIAGNOSIS — N179 Acute kidney failure, unspecified: Secondary | ICD-10-CM | POA: Diagnosis not present

## 2016-04-05 DIAGNOSIS — I10 Essential (primary) hypertension: Secondary | ICD-10-CM | POA: Diagnosis not present

## 2016-04-05 DIAGNOSIS — N390 Urinary tract infection, site not specified: Secondary | ICD-10-CM | POA: Diagnosis not present

## 2016-04-05 DIAGNOSIS — G43909 Migraine, unspecified, not intractable, without status migrainosus: Secondary | ICD-10-CM | POA: Diagnosis not present

## 2016-04-07 DIAGNOSIS — R279 Unspecified lack of coordination: Secondary | ICD-10-CM | POA: Diagnosis not present

## 2016-04-07 DIAGNOSIS — R2681 Unsteadiness on feet: Secondary | ICD-10-CM | POA: Diagnosis not present

## 2016-04-07 DIAGNOSIS — N179 Acute kidney failure, unspecified: Secondary | ICD-10-CM | POA: Diagnosis not present

## 2016-04-07 DIAGNOSIS — R296 Repeated falls: Secondary | ICD-10-CM | POA: Diagnosis not present

## 2016-04-07 DIAGNOSIS — E039 Hypothyroidism, unspecified: Secondary | ICD-10-CM | POA: Diagnosis not present

## 2016-04-07 DIAGNOSIS — Z79899 Other long term (current) drug therapy: Secondary | ICD-10-CM | POA: Diagnosis not present

## 2016-04-09 DIAGNOSIS — N179 Acute kidney failure, unspecified: Secondary | ICD-10-CM | POA: Diagnosis not present

## 2016-04-09 DIAGNOSIS — R296 Repeated falls: Secondary | ICD-10-CM | POA: Diagnosis not present

## 2016-04-09 DIAGNOSIS — R279 Unspecified lack of coordination: Secondary | ICD-10-CM | POA: Diagnosis not present

## 2016-04-09 DIAGNOSIS — R2681 Unsteadiness on feet: Secondary | ICD-10-CM | POA: Diagnosis not present

## 2016-04-10 DIAGNOSIS — R296 Repeated falls: Secondary | ICD-10-CM | POA: Diagnosis not present

## 2016-04-10 DIAGNOSIS — R2681 Unsteadiness on feet: Secondary | ICD-10-CM | POA: Diagnosis not present

## 2016-04-10 DIAGNOSIS — N179 Acute kidney failure, unspecified: Secondary | ICD-10-CM | POA: Diagnosis not present

## 2016-04-10 DIAGNOSIS — R279 Unspecified lack of coordination: Secondary | ICD-10-CM | POA: Diagnosis not present

## 2016-04-15 DIAGNOSIS — N179 Acute kidney failure, unspecified: Secondary | ICD-10-CM | POA: Diagnosis not present

## 2016-04-15 DIAGNOSIS — R296 Repeated falls: Secondary | ICD-10-CM | POA: Diagnosis not present

## 2016-04-15 DIAGNOSIS — R279 Unspecified lack of coordination: Secondary | ICD-10-CM | POA: Diagnosis not present

## 2016-04-15 DIAGNOSIS — R2681 Unsteadiness on feet: Secondary | ICD-10-CM | POA: Diagnosis not present

## 2016-04-16 DIAGNOSIS — R2681 Unsteadiness on feet: Secondary | ICD-10-CM | POA: Diagnosis not present

## 2016-04-16 DIAGNOSIS — N179 Acute kidney failure, unspecified: Secondary | ICD-10-CM | POA: Diagnosis not present

## 2016-04-16 DIAGNOSIS — R296 Repeated falls: Secondary | ICD-10-CM | POA: Diagnosis not present

## 2016-04-16 DIAGNOSIS — R279 Unspecified lack of coordination: Secondary | ICD-10-CM | POA: Diagnosis not present

## 2016-04-17 DIAGNOSIS — N179 Acute kidney failure, unspecified: Secondary | ICD-10-CM | POA: Diagnosis not present

## 2016-04-17 DIAGNOSIS — R279 Unspecified lack of coordination: Secondary | ICD-10-CM | POA: Diagnosis not present

## 2016-04-17 DIAGNOSIS — R2681 Unsteadiness on feet: Secondary | ICD-10-CM | POA: Diagnosis not present

## 2016-04-17 DIAGNOSIS — R296 Repeated falls: Secondary | ICD-10-CM | POA: Diagnosis not present

## 2016-04-20 DIAGNOSIS — N179 Acute kidney failure, unspecified: Secondary | ICD-10-CM | POA: Diagnosis not present

## 2016-04-20 DIAGNOSIS — R296 Repeated falls: Secondary | ICD-10-CM | POA: Diagnosis not present

## 2016-04-20 DIAGNOSIS — R2681 Unsteadiness on feet: Secondary | ICD-10-CM | POA: Diagnosis not present

## 2016-04-20 DIAGNOSIS — R279 Unspecified lack of coordination: Secondary | ICD-10-CM | POA: Diagnosis not present

## 2016-04-21 DIAGNOSIS — N179 Acute kidney failure, unspecified: Secondary | ICD-10-CM | POA: Diagnosis not present

## 2016-04-21 DIAGNOSIS — R279 Unspecified lack of coordination: Secondary | ICD-10-CM | POA: Diagnosis not present

## 2016-04-21 DIAGNOSIS — R296 Repeated falls: Secondary | ICD-10-CM | POA: Diagnosis not present

## 2016-04-21 DIAGNOSIS — R2681 Unsteadiness on feet: Secondary | ICD-10-CM | POA: Diagnosis not present

## 2016-04-22 DIAGNOSIS — R296 Repeated falls: Secondary | ICD-10-CM | POA: Diagnosis not present

## 2016-04-22 DIAGNOSIS — N179 Acute kidney failure, unspecified: Secondary | ICD-10-CM | POA: Diagnosis not present

## 2016-04-22 DIAGNOSIS — R2681 Unsteadiness on feet: Secondary | ICD-10-CM | POA: Diagnosis not present

## 2016-04-22 DIAGNOSIS — R279 Unspecified lack of coordination: Secondary | ICD-10-CM | POA: Diagnosis not present

## 2016-04-28 DIAGNOSIS — R279 Unspecified lack of coordination: Secondary | ICD-10-CM | POA: Diagnosis not present

## 2016-04-28 DIAGNOSIS — R296 Repeated falls: Secondary | ICD-10-CM | POA: Diagnosis not present

## 2016-04-28 DIAGNOSIS — R2681 Unsteadiness on feet: Secondary | ICD-10-CM | POA: Diagnosis not present

## 2016-04-28 DIAGNOSIS — N179 Acute kidney failure, unspecified: Secondary | ICD-10-CM | POA: Diagnosis not present

## 2016-04-29 DIAGNOSIS — R2681 Unsteadiness on feet: Secondary | ICD-10-CM | POA: Diagnosis not present

## 2016-04-29 DIAGNOSIS — R279 Unspecified lack of coordination: Secondary | ICD-10-CM | POA: Diagnosis not present

## 2016-04-29 DIAGNOSIS — N179 Acute kidney failure, unspecified: Secondary | ICD-10-CM | POA: Diagnosis not present

## 2016-04-29 DIAGNOSIS — R296 Repeated falls: Secondary | ICD-10-CM | POA: Diagnosis not present

## 2016-04-30 DIAGNOSIS — R279 Unspecified lack of coordination: Secondary | ICD-10-CM | POA: Diagnosis not present

## 2016-04-30 DIAGNOSIS — R2681 Unsteadiness on feet: Secondary | ICD-10-CM | POA: Diagnosis not present

## 2016-04-30 DIAGNOSIS — R296 Repeated falls: Secondary | ICD-10-CM | POA: Diagnosis not present

## 2016-04-30 DIAGNOSIS — N179 Acute kidney failure, unspecified: Secondary | ICD-10-CM | POA: Diagnosis not present

## 2016-05-04 DIAGNOSIS — R2681 Unsteadiness on feet: Secondary | ICD-10-CM | POA: Diagnosis not present

## 2016-05-04 DIAGNOSIS — N179 Acute kidney failure, unspecified: Secondary | ICD-10-CM | POA: Diagnosis not present

## 2016-05-04 DIAGNOSIS — R296 Repeated falls: Secondary | ICD-10-CM | POA: Diagnosis not present

## 2016-05-04 DIAGNOSIS — R279 Unspecified lack of coordination: Secondary | ICD-10-CM | POA: Diagnosis not present

## 2016-05-06 DIAGNOSIS — I70203 Unspecified atherosclerosis of native arteries of extremities, bilateral legs: Secondary | ICD-10-CM | POA: Diagnosis not present

## 2016-05-06 DIAGNOSIS — I739 Peripheral vascular disease, unspecified: Secondary | ICD-10-CM | POA: Diagnosis not present

## 2016-05-06 DIAGNOSIS — M79675 Pain in left toe(s): Secondary | ICD-10-CM | POA: Diagnosis not present

## 2016-05-06 DIAGNOSIS — B351 Tinea unguium: Secondary | ICD-10-CM | POA: Diagnosis not present

## 2016-06-28 DIAGNOSIS — B961 Klebsiella pneumoniae [K. pneumoniae] as the cause of diseases classified elsewhere: Secondary | ICD-10-CM | POA: Diagnosis not present

## 2016-06-28 DIAGNOSIS — I10 Essential (primary) hypertension: Secondary | ICD-10-CM | POA: Diagnosis not present

## 2016-06-28 DIAGNOSIS — K5900 Constipation, unspecified: Secondary | ICD-10-CM | POA: Diagnosis not present

## 2016-06-28 DIAGNOSIS — K219 Gastro-esophageal reflux disease without esophagitis: Secondary | ICD-10-CM | POA: Diagnosis not present

## 2016-06-28 DIAGNOSIS — N179 Acute kidney failure, unspecified: Secondary | ICD-10-CM | POA: Diagnosis not present

## 2016-06-28 DIAGNOSIS — E039 Hypothyroidism, unspecified: Secondary | ICD-10-CM | POA: Diagnosis not present

## 2016-06-28 DIAGNOSIS — R5381 Other malaise: Secondary | ICD-10-CM | POA: Diagnosis not present

## 2016-06-28 DIAGNOSIS — R109 Unspecified abdominal pain: Secondary | ICD-10-CM | POA: Diagnosis not present

## 2016-06-28 DIAGNOSIS — R5383 Other fatigue: Secondary | ICD-10-CM | POA: Diagnosis not present

## 2016-06-28 DIAGNOSIS — S32030D Wedge compression fracture of third lumbar vertebra, subsequent encounter for fracture with routine healing: Secondary | ICD-10-CM | POA: Diagnosis not present

## 2016-06-28 DIAGNOSIS — N390 Urinary tract infection, site not specified: Secondary | ICD-10-CM | POA: Diagnosis not present

## 2016-06-28 DIAGNOSIS — R509 Fever, unspecified: Secondary | ICD-10-CM | POA: Diagnosis not present

## 2016-07-01 DIAGNOSIS — R05 Cough: Secondary | ICD-10-CM | POA: Diagnosis not present

## 2016-07-04 DIAGNOSIS — J209 Acute bronchitis, unspecified: Secondary | ICD-10-CM | POA: Diagnosis not present

## 2016-07-04 DIAGNOSIS — N390 Urinary tract infection, site not specified: Secondary | ICD-10-CM | POA: Diagnosis not present

## 2016-07-24 DIAGNOSIS — M25562 Pain in left knee: Secondary | ICD-10-CM | POA: Diagnosis not present

## 2016-07-24 DIAGNOSIS — M25561 Pain in right knee: Secondary | ICD-10-CM | POA: Diagnosis not present

## 2016-07-24 DIAGNOSIS — R293 Abnormal posture: Secondary | ICD-10-CM | POA: Diagnosis not present

## 2016-07-24 DIAGNOSIS — M6281 Muscle weakness (generalized): Secondary | ICD-10-CM | POA: Diagnosis not present

## 2016-07-24 DIAGNOSIS — R1312 Dysphagia, oropharyngeal phase: Secondary | ICD-10-CM | POA: Diagnosis not present

## 2016-07-24 DIAGNOSIS — K219 Gastro-esophageal reflux disease without esophagitis: Secondary | ICD-10-CM | POA: Diagnosis not present

## 2016-07-24 DIAGNOSIS — R2681 Unsteadiness on feet: Secondary | ICD-10-CM | POA: Diagnosis not present

## 2016-07-28 DIAGNOSIS — M25561 Pain in right knee: Secondary | ICD-10-CM | POA: Diagnosis not present

## 2016-07-28 DIAGNOSIS — M25562 Pain in left knee: Secondary | ICD-10-CM | POA: Diagnosis not present

## 2016-07-28 DIAGNOSIS — R293 Abnormal posture: Secondary | ICD-10-CM | POA: Diagnosis not present

## 2016-07-28 DIAGNOSIS — M6281 Muscle weakness (generalized): Secondary | ICD-10-CM | POA: Diagnosis not present

## 2016-07-28 DIAGNOSIS — R2681 Unsteadiness on feet: Secondary | ICD-10-CM | POA: Diagnosis not present

## 2016-07-28 DIAGNOSIS — R1312 Dysphagia, oropharyngeal phase: Secondary | ICD-10-CM | POA: Diagnosis not present

## 2016-07-29 DIAGNOSIS — R293 Abnormal posture: Secondary | ICD-10-CM | POA: Diagnosis not present

## 2016-07-29 DIAGNOSIS — M6281 Muscle weakness (generalized): Secondary | ICD-10-CM | POA: Diagnosis not present

## 2016-07-29 DIAGNOSIS — M25561 Pain in right knee: Secondary | ICD-10-CM | POA: Diagnosis not present

## 2016-07-29 DIAGNOSIS — R2681 Unsteadiness on feet: Secondary | ICD-10-CM | POA: Diagnosis not present

## 2016-07-29 DIAGNOSIS — M25562 Pain in left knee: Secondary | ICD-10-CM | POA: Diagnosis not present

## 2016-07-29 DIAGNOSIS — R1312 Dysphagia, oropharyngeal phase: Secondary | ICD-10-CM | POA: Diagnosis not present

## 2016-07-30 DIAGNOSIS — M545 Low back pain: Secondary | ICD-10-CM | POA: Diagnosis not present

## 2016-07-31 DIAGNOSIS — M25561 Pain in right knee: Secondary | ICD-10-CM | POA: Diagnosis not present

## 2016-07-31 DIAGNOSIS — M6281 Muscle weakness (generalized): Secondary | ICD-10-CM | POA: Diagnosis not present

## 2016-07-31 DIAGNOSIS — R293 Abnormal posture: Secondary | ICD-10-CM | POA: Diagnosis not present

## 2016-07-31 DIAGNOSIS — M25562 Pain in left knee: Secondary | ICD-10-CM | POA: Diagnosis not present

## 2016-07-31 DIAGNOSIS — R1312 Dysphagia, oropharyngeal phase: Secondary | ICD-10-CM | POA: Diagnosis not present

## 2016-07-31 DIAGNOSIS — R2681 Unsteadiness on feet: Secondary | ICD-10-CM | POA: Diagnosis not present

## 2016-08-01 DIAGNOSIS — M549 Dorsalgia, unspecified: Secondary | ICD-10-CM | POA: Diagnosis not present

## 2016-08-01 DIAGNOSIS — H109 Unspecified conjunctivitis: Secondary | ICD-10-CM | POA: Diagnosis not present

## 2016-08-04 DIAGNOSIS — R293 Abnormal posture: Secondary | ICD-10-CM | POA: Diagnosis not present

## 2016-08-04 DIAGNOSIS — R2681 Unsteadiness on feet: Secondary | ICD-10-CM | POA: Diagnosis not present

## 2016-08-04 DIAGNOSIS — M25562 Pain in left knee: Secondary | ICD-10-CM | POA: Diagnosis not present

## 2016-08-04 DIAGNOSIS — M25561 Pain in right knee: Secondary | ICD-10-CM | POA: Diagnosis not present

## 2016-08-04 DIAGNOSIS — M6281 Muscle weakness (generalized): Secondary | ICD-10-CM | POA: Diagnosis not present

## 2016-08-04 DIAGNOSIS — R1312 Dysphagia, oropharyngeal phase: Secondary | ICD-10-CM | POA: Diagnosis not present

## 2016-08-05 DIAGNOSIS — M25561 Pain in right knee: Secondary | ICD-10-CM | POA: Diagnosis not present

## 2016-08-05 DIAGNOSIS — M25562 Pain in left knee: Secondary | ICD-10-CM | POA: Diagnosis not present

## 2016-08-05 DIAGNOSIS — R2681 Unsteadiness on feet: Secondary | ICD-10-CM | POA: Diagnosis not present

## 2016-08-05 DIAGNOSIS — R293 Abnormal posture: Secondary | ICD-10-CM | POA: Diagnosis not present

## 2016-08-05 DIAGNOSIS — M6281 Muscle weakness (generalized): Secondary | ICD-10-CM | POA: Diagnosis not present

## 2016-08-05 DIAGNOSIS — R1312 Dysphagia, oropharyngeal phase: Secondary | ICD-10-CM | POA: Diagnosis not present

## 2016-08-06 DIAGNOSIS — R1312 Dysphagia, oropharyngeal phase: Secondary | ICD-10-CM | POA: Diagnosis not present

## 2016-08-06 DIAGNOSIS — M25561 Pain in right knee: Secondary | ICD-10-CM | POA: Diagnosis not present

## 2016-08-06 DIAGNOSIS — R293 Abnormal posture: Secondary | ICD-10-CM | POA: Diagnosis not present

## 2016-08-06 DIAGNOSIS — M25562 Pain in left knee: Secondary | ICD-10-CM | POA: Diagnosis not present

## 2016-08-06 DIAGNOSIS — M6281 Muscle weakness (generalized): Secondary | ICD-10-CM | POA: Diagnosis not present

## 2016-08-06 DIAGNOSIS — R2681 Unsteadiness on feet: Secondary | ICD-10-CM | POA: Diagnosis not present

## 2016-08-07 DIAGNOSIS — R1312 Dysphagia, oropharyngeal phase: Secondary | ICD-10-CM | POA: Diagnosis not present

## 2016-08-07 DIAGNOSIS — R293 Abnormal posture: Secondary | ICD-10-CM | POA: Diagnosis not present

## 2016-08-07 DIAGNOSIS — M25561 Pain in right knee: Secondary | ICD-10-CM | POA: Diagnosis not present

## 2016-08-07 DIAGNOSIS — M25562 Pain in left knee: Secondary | ICD-10-CM | POA: Diagnosis not present

## 2016-08-07 DIAGNOSIS — R2681 Unsteadiness on feet: Secondary | ICD-10-CM | POA: Diagnosis not present

## 2016-08-07 DIAGNOSIS — M6281 Muscle weakness (generalized): Secondary | ICD-10-CM | POA: Diagnosis not present

## 2016-08-10 DIAGNOSIS — R293 Abnormal posture: Secondary | ICD-10-CM | POA: Diagnosis not present

## 2016-08-10 DIAGNOSIS — M25562 Pain in left knee: Secondary | ICD-10-CM | POA: Diagnosis not present

## 2016-08-10 DIAGNOSIS — R1312 Dysphagia, oropharyngeal phase: Secondary | ICD-10-CM | POA: Diagnosis not present

## 2016-08-10 DIAGNOSIS — R2681 Unsteadiness on feet: Secondary | ICD-10-CM | POA: Diagnosis not present

## 2016-08-10 DIAGNOSIS — M6281 Muscle weakness (generalized): Secondary | ICD-10-CM | POA: Diagnosis not present

## 2016-08-10 DIAGNOSIS — M25561 Pain in right knee: Secondary | ICD-10-CM | POA: Diagnosis not present

## 2016-08-11 DIAGNOSIS — M6281 Muscle weakness (generalized): Secondary | ICD-10-CM | POA: Diagnosis not present

## 2016-08-11 DIAGNOSIS — M25561 Pain in right knee: Secondary | ICD-10-CM | POA: Diagnosis not present

## 2016-08-11 DIAGNOSIS — R2681 Unsteadiness on feet: Secondary | ICD-10-CM | POA: Diagnosis not present

## 2016-08-11 DIAGNOSIS — M25562 Pain in left knee: Secondary | ICD-10-CM | POA: Diagnosis not present

## 2016-08-11 DIAGNOSIS — R1312 Dysphagia, oropharyngeal phase: Secondary | ICD-10-CM | POA: Diagnosis not present

## 2016-08-11 DIAGNOSIS — R293 Abnormal posture: Secondary | ICD-10-CM | POA: Diagnosis not present

## 2016-08-12 DIAGNOSIS — M6281 Muscle weakness (generalized): Secondary | ICD-10-CM | POA: Diagnosis not present

## 2016-08-12 DIAGNOSIS — R293 Abnormal posture: Secondary | ICD-10-CM | POA: Diagnosis not present

## 2016-08-12 DIAGNOSIS — R1312 Dysphagia, oropharyngeal phase: Secondary | ICD-10-CM | POA: Diagnosis not present

## 2016-08-12 DIAGNOSIS — M25562 Pain in left knee: Secondary | ICD-10-CM | POA: Diagnosis not present

## 2016-08-12 DIAGNOSIS — M25561 Pain in right knee: Secondary | ICD-10-CM | POA: Diagnosis not present

## 2016-08-12 DIAGNOSIS — R2681 Unsteadiness on feet: Secondary | ICD-10-CM | POA: Diagnosis not present

## 2016-08-13 DIAGNOSIS — M546 Pain in thoracic spine: Secondary | ICD-10-CM | POA: Diagnosis not present

## 2016-08-17 DIAGNOSIS — M25561 Pain in right knee: Secondary | ICD-10-CM | POA: Diagnosis not present

## 2016-08-17 DIAGNOSIS — R2681 Unsteadiness on feet: Secondary | ICD-10-CM | POA: Diagnosis not present

## 2016-08-17 DIAGNOSIS — M25562 Pain in left knee: Secondary | ICD-10-CM | POA: Diagnosis not present

## 2016-08-17 DIAGNOSIS — M6281 Muscle weakness (generalized): Secondary | ICD-10-CM | POA: Diagnosis not present

## 2016-08-17 DIAGNOSIS — R293 Abnormal posture: Secondary | ICD-10-CM | POA: Diagnosis not present

## 2016-08-18 DIAGNOSIS — M6281 Muscle weakness (generalized): Secondary | ICD-10-CM | POA: Diagnosis not present

## 2016-08-18 DIAGNOSIS — R2681 Unsteadiness on feet: Secondary | ICD-10-CM | POA: Diagnosis not present

## 2016-08-18 DIAGNOSIS — M25562 Pain in left knee: Secondary | ICD-10-CM | POA: Diagnosis not present

## 2016-08-18 DIAGNOSIS — R293 Abnormal posture: Secondary | ICD-10-CM | POA: Diagnosis not present

## 2016-08-18 DIAGNOSIS — M25561 Pain in right knee: Secondary | ICD-10-CM | POA: Diagnosis not present

## 2016-08-19 DIAGNOSIS — M25561 Pain in right knee: Secondary | ICD-10-CM | POA: Diagnosis not present

## 2016-08-19 DIAGNOSIS — R2681 Unsteadiness on feet: Secondary | ICD-10-CM | POA: Diagnosis not present

## 2016-08-19 DIAGNOSIS — M25562 Pain in left knee: Secondary | ICD-10-CM | POA: Diagnosis not present

## 2016-08-19 DIAGNOSIS — M6281 Muscle weakness (generalized): Secondary | ICD-10-CM | POA: Diagnosis not present

## 2016-08-19 DIAGNOSIS — R293 Abnormal posture: Secondary | ICD-10-CM | POA: Diagnosis not present

## 2016-08-20 DIAGNOSIS — R2681 Unsteadiness on feet: Secondary | ICD-10-CM | POA: Diagnosis not present

## 2016-08-20 DIAGNOSIS — M25561 Pain in right knee: Secondary | ICD-10-CM | POA: Diagnosis not present

## 2016-08-20 DIAGNOSIS — M6281 Muscle weakness (generalized): Secondary | ICD-10-CM | POA: Diagnosis not present

## 2016-08-20 DIAGNOSIS — M25562 Pain in left knee: Secondary | ICD-10-CM | POA: Diagnosis not present

## 2016-08-20 DIAGNOSIS — R293 Abnormal posture: Secondary | ICD-10-CM | POA: Diagnosis not present

## 2016-08-21 DIAGNOSIS — M6281 Muscle weakness (generalized): Secondary | ICD-10-CM | POA: Diagnosis not present

## 2016-08-21 DIAGNOSIS — R293 Abnormal posture: Secondary | ICD-10-CM | POA: Diagnosis not present

## 2016-08-21 DIAGNOSIS — M25561 Pain in right knee: Secondary | ICD-10-CM | POA: Diagnosis not present

## 2016-08-21 DIAGNOSIS — M25562 Pain in left knee: Secondary | ICD-10-CM | POA: Diagnosis not present

## 2016-08-21 DIAGNOSIS — R2681 Unsteadiness on feet: Secondary | ICD-10-CM | POA: Diagnosis not present

## 2016-08-25 DIAGNOSIS — M6281 Muscle weakness (generalized): Secondary | ICD-10-CM | POA: Diagnosis not present

## 2016-08-25 DIAGNOSIS — M25562 Pain in left knee: Secondary | ICD-10-CM | POA: Diagnosis not present

## 2016-08-25 DIAGNOSIS — R293 Abnormal posture: Secondary | ICD-10-CM | POA: Diagnosis not present

## 2016-08-25 DIAGNOSIS — M25561 Pain in right knee: Secondary | ICD-10-CM | POA: Diagnosis not present

## 2016-08-25 DIAGNOSIS — R2681 Unsteadiness on feet: Secondary | ICD-10-CM | POA: Diagnosis not present

## 2016-08-26 DIAGNOSIS — R2681 Unsteadiness on feet: Secondary | ICD-10-CM | POA: Diagnosis not present

## 2016-08-26 DIAGNOSIS — R293 Abnormal posture: Secondary | ICD-10-CM | POA: Diagnosis not present

## 2016-08-26 DIAGNOSIS — M6281 Muscle weakness (generalized): Secondary | ICD-10-CM | POA: Diagnosis not present

## 2016-08-26 DIAGNOSIS — M25561 Pain in right knee: Secondary | ICD-10-CM | POA: Diagnosis not present

## 2016-08-26 DIAGNOSIS — M25562 Pain in left knee: Secondary | ICD-10-CM | POA: Diagnosis not present

## 2016-08-28 DIAGNOSIS — M25561 Pain in right knee: Secondary | ICD-10-CM | POA: Diagnosis not present

## 2016-08-28 DIAGNOSIS — M25562 Pain in left knee: Secondary | ICD-10-CM | POA: Diagnosis not present

## 2016-08-28 DIAGNOSIS — R2681 Unsteadiness on feet: Secondary | ICD-10-CM | POA: Diagnosis not present

## 2016-08-28 DIAGNOSIS — R293 Abnormal posture: Secondary | ICD-10-CM | POA: Diagnosis not present

## 2016-08-28 DIAGNOSIS — M6281 Muscle weakness (generalized): Secondary | ICD-10-CM | POA: Diagnosis not present

## 2016-08-30 DIAGNOSIS — M546 Pain in thoracic spine: Secondary | ICD-10-CM | POA: Diagnosis not present

## 2016-08-30 DIAGNOSIS — H109 Unspecified conjunctivitis: Secondary | ICD-10-CM | POA: Diagnosis not present

## 2016-08-30 DIAGNOSIS — R400 Somnolence: Secondary | ICD-10-CM | POA: Diagnosis not present

## 2016-08-31 DIAGNOSIS — M6281 Muscle weakness (generalized): Secondary | ICD-10-CM | POA: Diagnosis not present

## 2016-08-31 DIAGNOSIS — M25561 Pain in right knee: Secondary | ICD-10-CM | POA: Diagnosis not present

## 2016-08-31 DIAGNOSIS — M25562 Pain in left knee: Secondary | ICD-10-CM | POA: Diagnosis not present

## 2016-08-31 DIAGNOSIS — R293 Abnormal posture: Secondary | ICD-10-CM | POA: Diagnosis not present

## 2016-08-31 DIAGNOSIS — R2681 Unsteadiness on feet: Secondary | ICD-10-CM | POA: Diagnosis not present

## 2016-09-01 DIAGNOSIS — R293 Abnormal posture: Secondary | ICD-10-CM | POA: Diagnosis not present

## 2016-09-01 DIAGNOSIS — R2681 Unsteadiness on feet: Secondary | ICD-10-CM | POA: Diagnosis not present

## 2016-09-01 DIAGNOSIS — M25561 Pain in right knee: Secondary | ICD-10-CM | POA: Diagnosis not present

## 2016-09-01 DIAGNOSIS — M6281 Muscle weakness (generalized): Secondary | ICD-10-CM | POA: Diagnosis not present

## 2016-09-01 DIAGNOSIS — M25562 Pain in left knee: Secondary | ICD-10-CM | POA: Diagnosis not present

## 2016-09-03 DIAGNOSIS — M25562 Pain in left knee: Secondary | ICD-10-CM | POA: Diagnosis not present

## 2016-09-03 DIAGNOSIS — M6281 Muscle weakness (generalized): Secondary | ICD-10-CM | POA: Diagnosis not present

## 2016-09-03 DIAGNOSIS — R293 Abnormal posture: Secondary | ICD-10-CM | POA: Diagnosis not present

## 2016-09-03 DIAGNOSIS — R2681 Unsteadiness on feet: Secondary | ICD-10-CM | POA: Diagnosis not present

## 2016-09-03 DIAGNOSIS — M25561 Pain in right knee: Secondary | ICD-10-CM | POA: Diagnosis not present

## 2016-09-04 DIAGNOSIS — M6281 Muscle weakness (generalized): Secondary | ICD-10-CM | POA: Diagnosis not present

## 2016-09-04 DIAGNOSIS — R293 Abnormal posture: Secondary | ICD-10-CM | POA: Diagnosis not present

## 2016-09-04 DIAGNOSIS — M25561 Pain in right knee: Secondary | ICD-10-CM | POA: Diagnosis not present

## 2016-09-04 DIAGNOSIS — R2681 Unsteadiness on feet: Secondary | ICD-10-CM | POA: Diagnosis not present

## 2016-09-04 DIAGNOSIS — M25562 Pain in left knee: Secondary | ICD-10-CM | POA: Diagnosis not present

## 2016-09-09 DIAGNOSIS — B351 Tinea unguium: Secondary | ICD-10-CM | POA: Diagnosis not present

## 2016-09-09 DIAGNOSIS — I739 Peripheral vascular disease, unspecified: Secondary | ICD-10-CM | POA: Diagnosis not present

## 2016-09-30 DIAGNOSIS — R109 Unspecified abdominal pain: Secondary | ICD-10-CM | POA: Diagnosis not present

## 2016-09-30 DIAGNOSIS — H109 Unspecified conjunctivitis: Secondary | ICD-10-CM | POA: Diagnosis not present

## 2016-09-30 DIAGNOSIS — K2970 Gastritis, unspecified, without bleeding: Secondary | ICD-10-CM | POA: Diagnosis not present

## 2016-10-01 DIAGNOSIS — N179 Acute kidney failure, unspecified: Secondary | ICD-10-CM | POA: Diagnosis not present

## 2016-10-01 DIAGNOSIS — B961 Klebsiella pneumoniae [K. pneumoniae] as the cause of diseases classified elsewhere: Secondary | ICD-10-CM | POA: Diagnosis not present

## 2016-10-01 DIAGNOSIS — K219 Gastro-esophageal reflux disease without esophagitis: Secondary | ICD-10-CM | POA: Diagnosis not present

## 2016-10-01 DIAGNOSIS — I10 Essential (primary) hypertension: Secondary | ICD-10-CM | POA: Diagnosis not present

## 2016-10-04 DIAGNOSIS — N390 Urinary tract infection, site not specified: Secondary | ICD-10-CM | POA: Diagnosis not present

## 2016-10-04 DIAGNOSIS — R1012 Left upper quadrant pain: Secondary | ICD-10-CM | POA: Diagnosis not present

## 2016-10-04 DIAGNOSIS — H109 Unspecified conjunctivitis: Secondary | ICD-10-CM | POA: Diagnosis not present

## 2016-10-13 DIAGNOSIS — N390 Urinary tract infection, site not specified: Secondary | ICD-10-CM | POA: Diagnosis not present

## 2016-10-14 DIAGNOSIS — Z0189 Encounter for other specified special examinations: Secondary | ICD-10-CM | POA: Diagnosis not present

## 2016-10-18 DIAGNOSIS — N390 Urinary tract infection, site not specified: Secondary | ICD-10-CM | POA: Diagnosis not present

## 2016-10-26 DIAGNOSIS — R509 Fever, unspecified: Secondary | ICD-10-CM | POA: Diagnosis not present

## 2016-10-26 DIAGNOSIS — N390 Urinary tract infection, site not specified: Secondary | ICD-10-CM | POA: Diagnosis not present

## 2016-10-30 DIAGNOSIS — M25561 Pain in right knee: Secondary | ICD-10-CM | POA: Diagnosis not present

## 2016-11-03 DIAGNOSIS — R278 Other lack of coordination: Secondary | ICD-10-CM | POA: Diagnosis not present

## 2016-11-03 DIAGNOSIS — R2681 Unsteadiness on feet: Secondary | ICD-10-CM | POA: Diagnosis not present

## 2016-11-03 DIAGNOSIS — N179 Acute kidney failure, unspecified: Secondary | ICD-10-CM | POA: Diagnosis not present

## 2016-11-03 DIAGNOSIS — M24561 Contracture, right knee: Secondary | ICD-10-CM | POA: Diagnosis not present

## 2016-11-03 DIAGNOSIS — M24562 Contracture, left knee: Secondary | ICD-10-CM | POA: Diagnosis not present

## 2016-11-04 DIAGNOSIS — M24561 Contracture, right knee: Secondary | ICD-10-CM | POA: Diagnosis not present

## 2016-11-04 DIAGNOSIS — R2681 Unsteadiness on feet: Secondary | ICD-10-CM | POA: Diagnosis not present

## 2016-11-04 DIAGNOSIS — R278 Other lack of coordination: Secondary | ICD-10-CM | POA: Diagnosis not present

## 2016-11-04 DIAGNOSIS — M24562 Contracture, left knee: Secondary | ICD-10-CM | POA: Diagnosis not present

## 2016-11-04 DIAGNOSIS — N179 Acute kidney failure, unspecified: Secondary | ICD-10-CM | POA: Diagnosis not present

## 2016-11-05 DIAGNOSIS — N179 Acute kidney failure, unspecified: Secondary | ICD-10-CM | POA: Diagnosis not present

## 2016-11-05 DIAGNOSIS — M24561 Contracture, right knee: Secondary | ICD-10-CM | POA: Diagnosis not present

## 2016-11-05 DIAGNOSIS — M24562 Contracture, left knee: Secondary | ICD-10-CM | POA: Diagnosis not present

## 2016-11-05 DIAGNOSIS — R2681 Unsteadiness on feet: Secondary | ICD-10-CM | POA: Diagnosis not present

## 2016-11-05 DIAGNOSIS — R278 Other lack of coordination: Secondary | ICD-10-CM | POA: Diagnosis not present

## 2016-11-12 DIAGNOSIS — R2681 Unsteadiness on feet: Secondary | ICD-10-CM | POA: Diagnosis not present

## 2016-11-12 DIAGNOSIS — M24562 Contracture, left knee: Secondary | ICD-10-CM | POA: Diagnosis not present

## 2016-11-12 DIAGNOSIS — N179 Acute kidney failure, unspecified: Secondary | ICD-10-CM | POA: Diagnosis not present

## 2016-11-12 DIAGNOSIS — R278 Other lack of coordination: Secondary | ICD-10-CM | POA: Diagnosis not present

## 2016-11-12 DIAGNOSIS — M24561 Contracture, right knee: Secondary | ICD-10-CM | POA: Diagnosis not present

## 2016-11-13 DIAGNOSIS — R2681 Unsteadiness on feet: Secondary | ICD-10-CM | POA: Diagnosis not present

## 2016-11-13 DIAGNOSIS — M24562 Contracture, left knee: Secondary | ICD-10-CM | POA: Diagnosis not present

## 2016-11-13 DIAGNOSIS — R278 Other lack of coordination: Secondary | ICD-10-CM | POA: Diagnosis not present

## 2016-11-13 DIAGNOSIS — N179 Acute kidney failure, unspecified: Secondary | ICD-10-CM | POA: Diagnosis not present

## 2016-11-13 DIAGNOSIS — M24561 Contracture, right knee: Secondary | ICD-10-CM | POA: Diagnosis not present

## 2016-11-13 DIAGNOSIS — R54 Age-related physical debility: Secondary | ICD-10-CM | POA: Diagnosis not present

## 2016-11-15 DIAGNOSIS — N390 Urinary tract infection, site not specified: Secondary | ICD-10-CM | POA: Diagnosis not present

## 2016-11-15 DIAGNOSIS — H578 Other specified disorders of eye and adnexa: Secondary | ICD-10-CM | POA: Diagnosis not present

## 2016-11-16 DIAGNOSIS — M24561 Contracture, right knee: Secondary | ICD-10-CM | POA: Diagnosis not present

## 2016-11-16 DIAGNOSIS — R278 Other lack of coordination: Secondary | ICD-10-CM | POA: Diagnosis not present

## 2016-11-16 DIAGNOSIS — M24562 Contracture, left knee: Secondary | ICD-10-CM | POA: Diagnosis not present

## 2016-11-16 DIAGNOSIS — R54 Age-related physical debility: Secondary | ICD-10-CM | POA: Diagnosis not present

## 2016-11-16 DIAGNOSIS — R2681 Unsteadiness on feet: Secondary | ICD-10-CM | POA: Diagnosis not present

## 2016-11-16 DIAGNOSIS — N179 Acute kidney failure, unspecified: Secondary | ICD-10-CM | POA: Diagnosis not present

## 2016-11-17 DIAGNOSIS — R2681 Unsteadiness on feet: Secondary | ICD-10-CM | POA: Diagnosis not present

## 2016-11-17 DIAGNOSIS — R54 Age-related physical debility: Secondary | ICD-10-CM | POA: Diagnosis not present

## 2016-11-17 DIAGNOSIS — N179 Acute kidney failure, unspecified: Secondary | ICD-10-CM | POA: Diagnosis not present

## 2016-11-17 DIAGNOSIS — M24561 Contracture, right knee: Secondary | ICD-10-CM | POA: Diagnosis not present

## 2016-11-17 DIAGNOSIS — M24562 Contracture, left knee: Secondary | ICD-10-CM | POA: Diagnosis not present

## 2016-11-17 DIAGNOSIS — R278 Other lack of coordination: Secondary | ICD-10-CM | POA: Diagnosis not present

## 2016-11-18 DIAGNOSIS — M24562 Contracture, left knee: Secondary | ICD-10-CM | POA: Diagnosis not present

## 2016-11-18 DIAGNOSIS — R54 Age-related physical debility: Secondary | ICD-10-CM | POA: Diagnosis not present

## 2016-11-18 DIAGNOSIS — M24561 Contracture, right knee: Secondary | ICD-10-CM | POA: Diagnosis not present

## 2016-11-18 DIAGNOSIS — R2681 Unsteadiness on feet: Secondary | ICD-10-CM | POA: Diagnosis not present

## 2016-11-18 DIAGNOSIS — N179 Acute kidney failure, unspecified: Secondary | ICD-10-CM | POA: Diagnosis not present

## 2016-11-18 DIAGNOSIS — R278 Other lack of coordination: Secondary | ICD-10-CM | POA: Diagnosis not present

## 2016-11-19 DIAGNOSIS — M24561 Contracture, right knee: Secondary | ICD-10-CM | POA: Diagnosis not present

## 2016-11-19 DIAGNOSIS — R2681 Unsteadiness on feet: Secondary | ICD-10-CM | POA: Diagnosis not present

## 2016-11-19 DIAGNOSIS — N179 Acute kidney failure, unspecified: Secondary | ICD-10-CM | POA: Diagnosis not present

## 2016-11-19 DIAGNOSIS — R54 Age-related physical debility: Secondary | ICD-10-CM | POA: Diagnosis not present

## 2016-11-19 DIAGNOSIS — M24562 Contracture, left knee: Secondary | ICD-10-CM | POA: Diagnosis not present

## 2016-11-19 DIAGNOSIS — R278 Other lack of coordination: Secondary | ICD-10-CM | POA: Diagnosis not present

## 2016-11-25 DIAGNOSIS — R54 Age-related physical debility: Secondary | ICD-10-CM | POA: Diagnosis not present

## 2016-11-25 DIAGNOSIS — R278 Other lack of coordination: Secondary | ICD-10-CM | POA: Diagnosis not present

## 2016-11-25 DIAGNOSIS — M24562 Contracture, left knee: Secondary | ICD-10-CM | POA: Diagnosis not present

## 2016-11-25 DIAGNOSIS — R2681 Unsteadiness on feet: Secondary | ICD-10-CM | POA: Diagnosis not present

## 2016-11-25 DIAGNOSIS — N179 Acute kidney failure, unspecified: Secondary | ICD-10-CM | POA: Diagnosis not present

## 2016-11-25 DIAGNOSIS — M24561 Contracture, right knee: Secondary | ICD-10-CM | POA: Diagnosis not present

## 2016-11-26 DIAGNOSIS — R2681 Unsteadiness on feet: Secondary | ICD-10-CM | POA: Diagnosis not present

## 2016-11-26 DIAGNOSIS — M24562 Contracture, left knee: Secondary | ICD-10-CM | POA: Diagnosis not present

## 2016-11-26 DIAGNOSIS — R54 Age-related physical debility: Secondary | ICD-10-CM | POA: Diagnosis not present

## 2016-11-26 DIAGNOSIS — N179 Acute kidney failure, unspecified: Secondary | ICD-10-CM | POA: Diagnosis not present

## 2016-11-26 DIAGNOSIS — R278 Other lack of coordination: Secondary | ICD-10-CM | POA: Diagnosis not present

## 2016-11-26 DIAGNOSIS — M24561 Contracture, right knee: Secondary | ICD-10-CM | POA: Diagnosis not present

## 2016-11-30 DIAGNOSIS — M24561 Contracture, right knee: Secondary | ICD-10-CM | POA: Diagnosis not present

## 2016-11-30 DIAGNOSIS — R2681 Unsteadiness on feet: Secondary | ICD-10-CM | POA: Diagnosis not present

## 2016-11-30 DIAGNOSIS — R278 Other lack of coordination: Secondary | ICD-10-CM | POA: Diagnosis not present

## 2016-11-30 DIAGNOSIS — R54 Age-related physical debility: Secondary | ICD-10-CM | POA: Diagnosis not present

## 2016-11-30 DIAGNOSIS — M24562 Contracture, left knee: Secondary | ICD-10-CM | POA: Diagnosis not present

## 2016-11-30 DIAGNOSIS — N179 Acute kidney failure, unspecified: Secondary | ICD-10-CM | POA: Diagnosis not present

## 2016-12-10 DIAGNOSIS — H401121 Primary open-angle glaucoma, left eye, mild stage: Secondary | ICD-10-CM | POA: Diagnosis not present

## 2016-12-26 DIAGNOSIS — R1314 Dysphagia, pharyngoesophageal phase: Secondary | ICD-10-CM | POA: Diagnosis not present

## 2016-12-28 DIAGNOSIS — R1314 Dysphagia, pharyngoesophageal phase: Secondary | ICD-10-CM | POA: Diagnosis not present

## 2016-12-30 DIAGNOSIS — R1314 Dysphagia, pharyngoesophageal phase: Secondary | ICD-10-CM | POA: Diagnosis not present

## 2017-01-04 DIAGNOSIS — R1314 Dysphagia, pharyngoesophageal phase: Secondary | ICD-10-CM | POA: Diagnosis not present

## 2017-01-06 DIAGNOSIS — R1314 Dysphagia, pharyngoesophageal phase: Secondary | ICD-10-CM | POA: Diagnosis not present

## 2017-01-08 DIAGNOSIS — R1314 Dysphagia, pharyngoesophageal phase: Secondary | ICD-10-CM | POA: Diagnosis not present

## 2017-02-14 DIAGNOSIS — R600 Localized edema: Secondary | ICD-10-CM | POA: Diagnosis not present

## 2017-02-15 DIAGNOSIS — N3281 Overactive bladder: Secondary | ICD-10-CM | POA: Diagnosis not present

## 2017-02-15 DIAGNOSIS — N179 Acute kidney failure, unspecified: Secondary | ICD-10-CM | POA: Diagnosis not present

## 2017-02-15 DIAGNOSIS — R5381 Other malaise: Secondary | ICD-10-CM | POA: Diagnosis not present

## 2017-02-15 DIAGNOSIS — N390 Urinary tract infection, site not specified: Secondary | ICD-10-CM | POA: Diagnosis not present

## 2017-02-16 DIAGNOSIS — R0602 Shortness of breath: Secondary | ICD-10-CM | POA: Diagnosis not present

## 2017-02-16 DIAGNOSIS — Z79899 Other long term (current) drug therapy: Secondary | ICD-10-CM | POA: Diagnosis not present

## 2017-02-18 DIAGNOSIS — M6281 Muscle weakness (generalized): Secondary | ICD-10-CM | POA: Diagnosis not present

## 2017-02-18 DIAGNOSIS — R32 Unspecified urinary incontinence: Secondary | ICD-10-CM | POA: Diagnosis not present

## 2017-02-18 DIAGNOSIS — B961 Klebsiella pneumoniae [K. pneumoniae] as the cause of diseases classified elsewhere: Secondary | ICD-10-CM | POA: Diagnosis not present

## 2017-02-18 DIAGNOSIS — R278 Other lack of coordination: Secondary | ICD-10-CM | POA: Diagnosis not present

## 2017-02-19 DIAGNOSIS — B961 Klebsiella pneumoniae [K. pneumoniae] as the cause of diseases classified elsewhere: Secondary | ICD-10-CM | POA: Diagnosis not present

## 2017-02-19 DIAGNOSIS — M6281 Muscle weakness (generalized): Secondary | ICD-10-CM | POA: Diagnosis not present

## 2017-02-19 DIAGNOSIS — R32 Unspecified urinary incontinence: Secondary | ICD-10-CM | POA: Diagnosis not present

## 2017-02-19 DIAGNOSIS — R278 Other lack of coordination: Secondary | ICD-10-CM | POA: Diagnosis not present

## 2017-02-24 DIAGNOSIS — R32 Unspecified urinary incontinence: Secondary | ICD-10-CM | POA: Diagnosis not present

## 2017-02-24 DIAGNOSIS — M6281 Muscle weakness (generalized): Secondary | ICD-10-CM | POA: Diagnosis not present

## 2017-02-24 DIAGNOSIS — R278 Other lack of coordination: Secondary | ICD-10-CM | POA: Diagnosis not present

## 2017-02-24 DIAGNOSIS — B961 Klebsiella pneumoniae [K. pneumoniae] as the cause of diseases classified elsewhere: Secondary | ICD-10-CM | POA: Diagnosis not present

## 2017-02-26 DIAGNOSIS — R600 Localized edema: Secondary | ICD-10-CM | POA: Diagnosis not present

## 2017-02-26 DIAGNOSIS — N390 Urinary tract infection, site not specified: Secondary | ICD-10-CM | POA: Diagnosis not present

## 2017-03-01 DIAGNOSIS — R32 Unspecified urinary incontinence: Secondary | ICD-10-CM | POA: Diagnosis not present

## 2017-03-01 DIAGNOSIS — Z0189 Encounter for other specified special examinations: Secondary | ICD-10-CM | POA: Diagnosis not present

## 2017-03-01 DIAGNOSIS — M6281 Muscle weakness (generalized): Secondary | ICD-10-CM | POA: Diagnosis not present

## 2017-03-01 DIAGNOSIS — B961 Klebsiella pneumoniae [K. pneumoniae] as the cause of diseases classified elsewhere: Secondary | ICD-10-CM | POA: Diagnosis not present

## 2017-03-01 DIAGNOSIS — R278 Other lack of coordination: Secondary | ICD-10-CM | POA: Diagnosis not present

## 2017-03-02 DIAGNOSIS — R278 Other lack of coordination: Secondary | ICD-10-CM | POA: Diagnosis not present

## 2017-03-02 DIAGNOSIS — M6281 Muscle weakness (generalized): Secondary | ICD-10-CM | POA: Diagnosis not present

## 2017-03-02 DIAGNOSIS — B961 Klebsiella pneumoniae [K. pneumoniae] as the cause of diseases classified elsewhere: Secondary | ICD-10-CM | POA: Diagnosis not present

## 2017-03-02 DIAGNOSIS — R32 Unspecified urinary incontinence: Secondary | ICD-10-CM | POA: Diagnosis not present

## 2017-03-03 DIAGNOSIS — B961 Klebsiella pneumoniae [K. pneumoniae] as the cause of diseases classified elsewhere: Secondary | ICD-10-CM | POA: Diagnosis not present

## 2017-03-03 DIAGNOSIS — R278 Other lack of coordination: Secondary | ICD-10-CM | POA: Diagnosis not present

## 2017-03-03 DIAGNOSIS — M6281 Muscle weakness (generalized): Secondary | ICD-10-CM | POA: Diagnosis not present

## 2017-03-03 DIAGNOSIS — R32 Unspecified urinary incontinence: Secondary | ICD-10-CM | POA: Diagnosis not present

## 2017-03-04 DIAGNOSIS — M6281 Muscle weakness (generalized): Secondary | ICD-10-CM | POA: Diagnosis not present

## 2017-03-04 DIAGNOSIS — R278 Other lack of coordination: Secondary | ICD-10-CM | POA: Diagnosis not present

## 2017-03-04 DIAGNOSIS — R32 Unspecified urinary incontinence: Secondary | ICD-10-CM | POA: Diagnosis not present

## 2017-03-04 DIAGNOSIS — B961 Klebsiella pneumoniae [K. pneumoniae] as the cause of diseases classified elsewhere: Secondary | ICD-10-CM | POA: Diagnosis not present

## 2017-03-05 DIAGNOSIS — M6281 Muscle weakness (generalized): Secondary | ICD-10-CM | POA: Diagnosis not present

## 2017-03-05 DIAGNOSIS — R32 Unspecified urinary incontinence: Secondary | ICD-10-CM | POA: Diagnosis not present

## 2017-03-05 DIAGNOSIS — B961 Klebsiella pneumoniae [K. pneumoniae] as the cause of diseases classified elsewhere: Secondary | ICD-10-CM | POA: Diagnosis not present

## 2017-03-05 DIAGNOSIS — R278 Other lack of coordination: Secondary | ICD-10-CM | POA: Diagnosis not present

## 2017-03-10 DIAGNOSIS — M6281 Muscle weakness (generalized): Secondary | ICD-10-CM | POA: Diagnosis not present

## 2017-03-10 DIAGNOSIS — B961 Klebsiella pneumoniae [K. pneumoniae] as the cause of diseases classified elsewhere: Secondary | ICD-10-CM | POA: Diagnosis not present

## 2017-03-10 DIAGNOSIS — R278 Other lack of coordination: Secondary | ICD-10-CM | POA: Diagnosis not present

## 2017-03-10 DIAGNOSIS — R32 Unspecified urinary incontinence: Secondary | ICD-10-CM | POA: Diagnosis not present

## 2017-03-12 DIAGNOSIS — B961 Klebsiella pneumoniae [K. pneumoniae] as the cause of diseases classified elsewhere: Secondary | ICD-10-CM | POA: Diagnosis not present

## 2017-03-12 DIAGNOSIS — M6281 Muscle weakness (generalized): Secondary | ICD-10-CM | POA: Diagnosis not present

## 2017-03-12 DIAGNOSIS — R32 Unspecified urinary incontinence: Secondary | ICD-10-CM | POA: Diagnosis not present

## 2017-03-12 DIAGNOSIS — R278 Other lack of coordination: Secondary | ICD-10-CM | POA: Diagnosis not present

## 2017-03-16 DIAGNOSIS — R32 Unspecified urinary incontinence: Secondary | ICD-10-CM | POA: Diagnosis not present

## 2017-03-16 DIAGNOSIS — M6281 Muscle weakness (generalized): Secondary | ICD-10-CM | POA: Diagnosis not present

## 2017-03-16 DIAGNOSIS — B961 Klebsiella pneumoniae [K. pneumoniae] as the cause of diseases classified elsewhere: Secondary | ICD-10-CM | POA: Diagnosis not present

## 2017-03-16 DIAGNOSIS — R278 Other lack of coordination: Secondary | ICD-10-CM | POA: Diagnosis not present

## 2017-03-22 DIAGNOSIS — R278 Other lack of coordination: Secondary | ICD-10-CM | POA: Diagnosis not present

## 2017-03-22 DIAGNOSIS — R32 Unspecified urinary incontinence: Secondary | ICD-10-CM | POA: Diagnosis not present

## 2017-03-22 DIAGNOSIS — M6281 Muscle weakness (generalized): Secondary | ICD-10-CM | POA: Diagnosis not present

## 2017-03-22 DIAGNOSIS — B961 Klebsiella pneumoniae [K. pneumoniae] as the cause of diseases classified elsewhere: Secondary | ICD-10-CM | POA: Diagnosis not present

## 2017-04-16 DIAGNOSIS — D6832 Hemorrhagic disorder due to extrinsic circulating anticoagulants: Secondary | ICD-10-CM | POA: Diagnosis not present

## 2017-04-16 DIAGNOSIS — B351 Tinea unguium: Secondary | ICD-10-CM | POA: Diagnosis not present

## 2017-04-16 DIAGNOSIS — L603 Nail dystrophy: Secondary | ICD-10-CM | POA: Diagnosis not present

## 2017-07-13 DIAGNOSIS — Z79899 Other long term (current) drug therapy: Secondary | ICD-10-CM | POA: Diagnosis not present

## 2017-07-13 DIAGNOSIS — R1032 Left lower quadrant pain: Secondary | ICD-10-CM | POA: Diagnosis not present

## 2017-07-13 DIAGNOSIS — D649 Anemia, unspecified: Secondary | ICD-10-CM | POA: Diagnosis not present

## 2017-07-13 DIAGNOSIS — N390 Urinary tract infection, site not specified: Secondary | ICD-10-CM | POA: Diagnosis not present

## 2017-07-13 DIAGNOSIS — R4182 Altered mental status, unspecified: Secondary | ICD-10-CM | POA: Diagnosis not present

## 2017-07-13 DIAGNOSIS — E708 Other disorders of aromatic amino-acid metabolism: Secondary | ICD-10-CM | POA: Diagnosis not present

## 2017-07-13 DIAGNOSIS — R109 Unspecified abdominal pain: Secondary | ICD-10-CM | POA: Diagnosis not present

## 2017-07-18 DIAGNOSIS — R109 Unspecified abdominal pain: Secondary | ICD-10-CM | POA: Diagnosis not present

## 2017-07-18 DIAGNOSIS — N390 Urinary tract infection, site not specified: Secondary | ICD-10-CM | POA: Diagnosis not present

## 2017-07-22 DIAGNOSIS — R262 Difficulty in walking, not elsewhere classified: Secondary | ICD-10-CM | POA: Diagnosis not present

## 2017-07-22 DIAGNOSIS — B351 Tinea unguium: Secondary | ICD-10-CM | POA: Diagnosis not present

## 2017-07-22 DIAGNOSIS — I739 Peripheral vascular disease, unspecified: Secondary | ICD-10-CM | POA: Diagnosis not present

## 2017-10-12 DIAGNOSIS — R319 Hematuria, unspecified: Secondary | ICD-10-CM | POA: Diagnosis not present

## 2017-10-12 DIAGNOSIS — N390 Urinary tract infection, site not specified: Secondary | ICD-10-CM | POA: Diagnosis not present

## 2017-10-12 DIAGNOSIS — Z79899 Other long term (current) drug therapy: Secondary | ICD-10-CM | POA: Diagnosis not present

## 2017-10-12 DIAGNOSIS — R69 Illness, unspecified: Secondary | ICD-10-CM | POA: Diagnosis not present

## 2017-10-13 DIAGNOSIS — N390 Urinary tract infection, site not specified: Secondary | ICD-10-CM | POA: Diagnosis not present

## 2017-10-13 DIAGNOSIS — R262 Difficulty in walking, not elsewhere classified: Secondary | ICD-10-CM | POA: Diagnosis not present

## 2017-10-13 DIAGNOSIS — B351 Tinea unguium: Secondary | ICD-10-CM | POA: Diagnosis not present

## 2017-10-13 DIAGNOSIS — I739 Peripheral vascular disease, unspecified: Secondary | ICD-10-CM | POA: Diagnosis not present

## 2017-10-17 DIAGNOSIS — N390 Urinary tract infection, site not specified: Secondary | ICD-10-CM | POA: Diagnosis not present

## 2018-01-12 DIAGNOSIS — B351 Tinea unguium: Secondary | ICD-10-CM | POA: Diagnosis not present

## 2018-01-12 DIAGNOSIS — I739 Peripheral vascular disease, unspecified: Secondary | ICD-10-CM | POA: Diagnosis not present

## 2018-01-12 DIAGNOSIS — D6832 Hemorrhagic disorder due to extrinsic circulating anticoagulants: Secondary | ICD-10-CM | POA: Diagnosis not present

## 2018-01-15 DIAGNOSIS — R410 Disorientation, unspecified: Secondary | ICD-10-CM | POA: Diagnosis not present

## 2018-02-17 DIAGNOSIS — D3131 Benign neoplasm of right choroid: Secondary | ICD-10-CM | POA: Diagnosis not present

## 2018-02-17 DIAGNOSIS — H04123 Dry eye syndrome of bilateral lacrimal glands: Secondary | ICD-10-CM | POA: Diagnosis not present

## 2018-02-17 DIAGNOSIS — H401134 Primary open-angle glaucoma, bilateral, indeterminate stage: Secondary | ICD-10-CM | POA: Diagnosis not present

## 2018-02-17 DIAGNOSIS — Z961 Presence of intraocular lens: Secondary | ICD-10-CM | POA: Diagnosis not present

## 2018-03-24 DIAGNOSIS — L603 Nail dystrophy: Secondary | ICD-10-CM | POA: Diagnosis not present

## 2018-03-24 DIAGNOSIS — I739 Peripheral vascular disease, unspecified: Secondary | ICD-10-CM | POA: Diagnosis not present

## 2018-03-24 DIAGNOSIS — B351 Tinea unguium: Secondary | ICD-10-CM | POA: Diagnosis not present

## 2018-04-03 DIAGNOSIS — E039 Hypothyroidism, unspecified: Secondary | ICD-10-CM | POA: Diagnosis not present

## 2018-04-03 DIAGNOSIS — N390 Urinary tract infection, site not specified: Secondary | ICD-10-CM | POA: Diagnosis not present

## 2018-04-03 DIAGNOSIS — I10 Essential (primary) hypertension: Secondary | ICD-10-CM | POA: Diagnosis not present

## 2018-04-04 DIAGNOSIS — D649 Anemia, unspecified: Secondary | ICD-10-CM | POA: Diagnosis not present

## 2018-04-04 DIAGNOSIS — E039 Hypothyroidism, unspecified: Secondary | ICD-10-CM | POA: Diagnosis not present

## 2018-04-04 DIAGNOSIS — E559 Vitamin D deficiency, unspecified: Secondary | ICD-10-CM | POA: Diagnosis not present

## 2018-04-04 DIAGNOSIS — I10 Essential (primary) hypertension: Secondary | ICD-10-CM | POA: Diagnosis not present

## 2018-04-10 DIAGNOSIS — R600 Localized edema: Secondary | ICD-10-CM | POA: Diagnosis not present

## 2018-04-11 DIAGNOSIS — D649 Anemia, unspecified: Secondary | ICD-10-CM | POA: Diagnosis not present

## 2018-04-11 DIAGNOSIS — R4182 Altered mental status, unspecified: Secondary | ICD-10-CM | POA: Diagnosis not present

## 2018-04-11 DIAGNOSIS — I10 Essential (primary) hypertension: Secondary | ICD-10-CM | POA: Diagnosis not present

## 2018-04-11 DIAGNOSIS — Z79899 Other long term (current) drug therapy: Secondary | ICD-10-CM | POA: Diagnosis not present

## 2018-04-11 DIAGNOSIS — R0602 Shortness of breath: Secondary | ICD-10-CM | POA: Diagnosis not present

## 2019-12-14 DEATH — deceased
